# Patient Record
Sex: Male | Born: 1935 | Race: White | Hispanic: No | Marital: Married | Smoking: Former smoker
Health system: Southern US, Community
[De-identification: ages and names within clinical notes are randomized; demographics above are authoritative.]

## PROBLEM LIST (undated history)

## (undated) DIAGNOSIS — C61 Malignant neoplasm of prostate: Secondary | ICD-10-CM

## (undated) DIAGNOSIS — M1990 Unspecified osteoarthritis, unspecified site: Secondary | ICD-10-CM

## (undated) DIAGNOSIS — H269 Unspecified cataract: Secondary | ICD-10-CM

## (undated) DIAGNOSIS — I10 Essential (primary) hypertension: Secondary | ICD-10-CM

## (undated) HISTORY — PX: CATARACT EXTRACTION: SUR2

---

## 1969-05-01 HISTORY — PX: CHOLECYSTECTOMY: SHX55

## 2013-12-26 ENCOUNTER — Emergency Department (HOSPITAL_COMMUNITY): Payer: Medicare Other

## 2013-12-26 ENCOUNTER — Encounter (HOSPITAL_COMMUNITY): Payer: Self-pay | Admitting: Emergency Medicine

## 2013-12-26 ENCOUNTER — Emergency Department (INDEPENDENT_AMBULATORY_CARE_PROVIDER_SITE_OTHER)
Admission: EM | Admit: 2013-12-26 | Discharge: 2013-12-26 | Disposition: A | Discharge status: Hospice | Payer: Medicare Other | Source: Home / Self Care | Attending: Family Medicine | Admitting: Family Medicine

## 2013-12-26 ENCOUNTER — Inpatient Hospital Stay (HOSPITAL_COMMUNITY)
Admission: EM | Admit: 2013-12-26 | Discharge: 2013-12-30 | DRG: 682 | Disposition: A | Discharge status: Home / Self Care | Payer: Medicare Other | Attending: Internal Medicine | Admitting: Internal Medicine

## 2013-12-26 DIAGNOSIS — R634 Abnormal weight loss: Secondary | ICD-10-CM

## 2013-12-26 DIAGNOSIS — E43 Unspecified severe protein-calorie malnutrition: Secondary | ICD-10-CM

## 2013-12-26 DIAGNOSIS — Z681 Body mass index (BMI) 19 or less, adult: Secondary | ICD-10-CM

## 2013-12-26 DIAGNOSIS — R040 Epistaxis: Secondary | ICD-10-CM | POA: Diagnosis not present

## 2013-12-26 DIAGNOSIS — R339 Retention of urine, unspecified: Secondary | ICD-10-CM | POA: Diagnosis present

## 2013-12-26 DIAGNOSIS — I447 Left bundle-branch block, unspecified: Secondary | ICD-10-CM | POA: Diagnosis present

## 2013-12-26 DIAGNOSIS — N19 Unspecified kidney failure: Secondary | ICD-10-CM

## 2013-12-26 DIAGNOSIS — E872 Acidosis, unspecified: Secondary | ICD-10-CM | POA: Diagnosis present

## 2013-12-26 DIAGNOSIS — E875 Hyperkalemia: Secondary | ICD-10-CM

## 2013-12-26 DIAGNOSIS — N401 Enlarged prostate with lower urinary tract symptoms: Secondary | ICD-10-CM | POA: Diagnosis present

## 2013-12-26 DIAGNOSIS — N138 Other obstructive and reflux uropathy: Secondary | ICD-10-CM | POA: Diagnosis present

## 2013-12-26 DIAGNOSIS — N179 Acute kidney failure, unspecified: Principal | ICD-10-CM

## 2013-12-26 DIAGNOSIS — Z66 Do not resuscitate: Secondary | ICD-10-CM | POA: Diagnosis present

## 2013-12-26 DIAGNOSIS — E870 Hyperosmolality and hypernatremia: Secondary | ICD-10-CM | POA: Diagnosis present

## 2013-12-26 DIAGNOSIS — I10 Essential (primary) hypertension: Secondary | ICD-10-CM

## 2013-12-26 DIAGNOSIS — N139 Obstructive and reflux uropathy, unspecified: Secondary | ICD-10-CM

## 2013-12-26 LAB — COMPREHENSIVE METABOLIC PANEL
ALBUMIN: 3.8 g/dL (ref 3.5–5.2)
ALK PHOS: 98 U/L (ref 39–117)
ALT: 12 U/L (ref 0–53)
AST: 12 U/L (ref 0–37)
BUN: 110 mg/dL — ABNORMAL HIGH (ref 6–23)
CALCIUM: 9.5 mg/dL (ref 8.4–10.5)
CO2: 11 mEq/L — ABNORMAL LOW (ref 19–32)
Chloride: 115 mEq/L — ABNORMAL HIGH (ref 96–112)
Creatinine, Ser: 13.34 mg/dL — ABNORMAL HIGH (ref 0.50–1.35)
GFR calc non Af Amer: 3 mL/min — ABNORMAL LOW (ref >90)
GFR, EST AFRICAN AMERICAN: 3 mL/min — AB (ref >90)
GLUCOSE: 94 mg/dL (ref 70–99)
POTASSIUM: 7.1 meq/L — AB (ref 3.7–5.3)
Sodium: 146 mEq/L (ref 137–147)
TOTAL PROTEIN: 7.5 g/dL (ref 6.0–8.3)
Total Bilirubin: 0.3 mg/dL (ref 0.3–1.2)

## 2013-12-26 LAB — BASIC METABOLIC PANEL
BUN: 86 mg/dL — ABNORMAL HIGH (ref 6–23)
CALCIUM: 9.9 mg/dL (ref 8.4–10.5)
CHLORIDE: 117 meq/L — AB (ref 96–112)
CO2: 14 mEq/L — ABNORMAL LOW (ref 19–32)
CREATININE: 8.03 mg/dL — AB (ref 0.50–1.35)
GFR calc Af Amer: 6 mL/min — ABNORMAL LOW (ref >90)
GFR calc non Af Amer: 5 mL/min — ABNORMAL LOW (ref >90)
GLUCOSE: 85 mg/dL (ref 70–99)
Potassium: 5 mEq/L (ref 3.7–5.3)
Sodium: 150 mEq/L — ABNORMAL HIGH (ref 137–147)

## 2013-12-26 LAB — CBC WITH DIFFERENTIAL/PLATELET
BASOS ABS: 0 10*3/uL (ref 0.0–0.1)
Basophils Relative: 0 % (ref 0–1)
EOS PCT: 2 % (ref 0–5)
Eosinophils Absolute: 0.2 10*3/uL (ref 0.0–0.7)
HCT: 42.1 % (ref 39.0–52.0)
Hemoglobin: 14 g/dL (ref 13.0–17.0)
LYMPHS ABS: 0.7 10*3/uL (ref 0.7–4.0)
Lymphocytes Relative: 8 % — ABNORMAL LOW (ref 12–46)
MCH: 28.6 pg (ref 26.0–34.0)
MCHC: 33.3 g/dL (ref 30.0–36.0)
MCV: 86.1 fL (ref 78.0–100.0)
Monocytes Absolute: 0.9 10*3/uL (ref 0.1–1.0)
Monocytes Relative: 10 % (ref 3–12)
NEUTROS PCT: 80 % — AB (ref 43–77)
Neutro Abs: 7 10*3/uL (ref 1.7–7.7)
PLATELETS: 265 10*3/uL (ref 150–400)
RBC: 4.89 MIL/uL (ref 4.22–5.81)
RDW: 16.1 % — AB (ref 11.5–15.5)
WBC: 8.8 10*3/uL (ref 4.0–10.5)

## 2013-12-26 LAB — I-STAT ARTERIAL BLOOD GAS, ED
Acid-base deficit: 16 mmol/L — ABNORMAL HIGH (ref 0.0–2.0)
Bicarbonate: 8.6 mEq/L — ABNORMAL LOW (ref 20.0–24.0)
O2 Saturation: 97 %
PH ART: 7.251 — AB (ref 7.350–7.450)
TCO2: 9 mmol/L (ref 0–100)
pCO2 arterial: 19.7 mmHg — CL (ref 35.0–45.0)
pO2, Arterial: 107 mmHg — ABNORMAL HIGH (ref 80.0–100.0)

## 2013-12-26 LAB — URINALYSIS, ROUTINE W REFLEX MICROSCOPIC
Bilirubin Urine: NEGATIVE
Glucose, UA: NEGATIVE mg/dL
Ketones, ur: NEGATIVE mg/dL
Leukocytes, UA: NEGATIVE
NITRITE: NEGATIVE
PH: 5.5 (ref 5.0–8.0)
Protein, ur: NEGATIVE mg/dL
SPECIFIC GRAVITY, URINE: 1.017 (ref 1.005–1.030)
Urobilinogen, UA: 0.2 mg/dL (ref 0.0–1.0)

## 2013-12-26 LAB — TROPONIN I

## 2013-12-26 LAB — CBC
HCT: 41.9 % (ref 39.0–52.0)
HEMOGLOBIN: 14.2 g/dL (ref 13.0–17.0)
MCH: 28.7 pg (ref 26.0–34.0)
MCHC: 33.9 g/dL (ref 30.0–36.0)
MCV: 84.6 fL (ref 78.0–100.0)
PLATELETS: 254 10*3/uL (ref 150–400)
RBC: 4.95 MIL/uL (ref 4.22–5.81)
RDW: 16 % — ABNORMAL HIGH (ref 11.5–15.5)
WBC: 9.3 10*3/uL (ref 4.0–10.5)

## 2013-12-26 LAB — URINE MICROSCOPIC-ADD ON

## 2013-12-26 LAB — MRSA PCR SCREENING: MRSA BY PCR: NEGATIVE

## 2013-12-26 LAB — LIPASE, BLOOD: LIPASE: 78 U/L — AB (ref 11–59)

## 2013-12-26 MED ORDER — SODIUM CHLORIDE 0.9 % IV SOLN: INTRAVENOUS | Status: DC

## 2013-12-26 MED ORDER — ONDANSETRON HCL 4 MG/2ML IJ SOLN: 4.0000 mg | Freq: Four times a day (QID) | INTRAMUSCULAR | Status: DC | PRN

## 2013-12-26 MED ORDER — GUAIFENESIN-DM 100-10 MG/5ML PO SYRP
5.0000 mL | ORAL_SOLUTION | ORAL | Status: DC | PRN
  Filled 2013-12-26: qty 5

## 2013-12-26 MED ORDER — SODIUM POLYSTYRENE SULFONATE 15 GM/60ML PO SUSP
30.0000 g | Freq: Once | ORAL | Status: AC
  Administered 2013-12-26: 30 g via ORAL
  Filled 2013-12-26: qty 120

## 2013-12-26 MED ORDER — ACETAMINOPHEN 650 MG RE SUPP: 650.0000 mg | Freq: Four times a day (QID) | RECTAL | Status: DC | PRN

## 2013-12-26 MED ORDER — TAMSULOSIN HCL 0.4 MG PO CAPS
0.4000 mg | ORAL_CAPSULE | Freq: Every day | ORAL | Status: DC
  Administered 2013-12-26 – 2013-12-30 (×5): 0.4 mg via ORAL
  Filled 2013-12-26 (×5): qty 1

## 2013-12-26 MED ORDER — INSULIN ASPART 100 UNIT/ML ~~LOC~~ SOLN
10.0000 [IU] | Freq: Once | SUBCUTANEOUS | Status: AC
  Administered 2013-12-26: 10 [IU] via INTRAVENOUS
  Filled 2013-12-26: qty 1

## 2013-12-26 MED ORDER — ONDANSETRON HCL 4 MG PO TABS
4.0000 mg | ORAL_TABLET | Freq: Four times a day (QID) | ORAL | Status: DC | PRN
  Administered 2013-12-29: 4 mg via ORAL
  Filled 2013-12-26: qty 1

## 2013-12-26 MED ORDER — ALBUTEROL SULFATE (2.5 MG/3ML) 0.083% IN NEBU: 2.5000 mg | INHALATION_SOLUTION | RESPIRATORY_TRACT | Status: DC | PRN

## 2013-12-26 MED ORDER — HEPARIN SODIUM (PORCINE) 5000 UNIT/ML IJ SOLN
5000.0000 [IU] | Freq: Three times a day (TID) | INTRAMUSCULAR | Status: DC
  Administered 2013-12-26 – 2013-12-28 (×5): 5000 [IU] via SUBCUTANEOUS
  Filled 2013-12-26 (×10): qty 1

## 2013-12-26 MED ORDER — DEXTROSE 50 % IV SOLN
1.0000 | Freq: Once | INTRAVENOUS | Status: AC
  Administered 2013-12-26: 50 mL via INTRAVENOUS
  Filled 2013-12-26: qty 50

## 2013-12-26 MED ORDER — AMLODIPINE BESYLATE 5 MG PO TABS
5.0000 mg | ORAL_TABLET | Freq: Every day | ORAL | Status: DC
  Administered 2013-12-26 – 2013-12-30 (×5): 5 mg via ORAL
  Filled 2013-12-26 (×5): qty 1

## 2013-12-26 MED ORDER — DEXTROSE 5 % IV SOLN
INTRAVENOUS | Status: DC
  Administered 2013-12-26 – 2013-12-27 (×2): via INTRAVENOUS
  Filled 2013-12-26 (×5): qty 1000

## 2013-12-26 MED ORDER — SODIUM CHLORIDE 0.9 % IJ SOLN
3.0000 mL | Freq: Two times a day (BID) | INTRAMUSCULAR | Status: DC
  Administered 2013-12-26 – 2013-12-29 (×6): 3 mL via INTRAVENOUS

## 2013-12-26 MED ORDER — ACETAMINOPHEN 325 MG PO TABS: 650.0000 mg | ORAL_TABLET | Freq: Four times a day (QID) | ORAL | Status: DC | PRN

## 2013-12-26 MED ORDER — SODIUM CHLORIDE 0.9 % IV SOLN
1.0000 g | Freq: Once | INTRAVENOUS | Status: AC
  Administered 2013-12-26: 1 g via INTRAVENOUS
  Filled 2013-12-26: qty 10

## 2013-12-26 NOTE — H&P (Addendum)
PATIENT DETAILS Name: Brett Anthony Age: 80 y.o. Sex: male Date of Birth: [birth year] Admit Date: 12/26/2013 PCP:No primary provider on file.   CHIEF COMPLAINT:  Malaise, weight loss, poor appetite for the past 6 weeks  HPI: Brett Anthony is a 80 y.o. male with no significant Past Medical History  who presents today with the above noted complaint. Patient has not seen a physician since 1982. For the past 6 weeks, patient has had malaise, weakness and poor appetite. He has been drinking a lot of liquids and Ensure. He also claims that for the past 2 days he has had intermittent loose stools-2-3 times a day. Denies any fever. He claims that he has aches and pains and for the past 2 weeks he has been taking Advil at least twice a day on a daily basis. This morning he was weak, could not get up and was having dizziness upon attempting to stand up. As his result he was brought to the emergency room where he was found to have a creatinine of 13 with a potassium of 7.1. As a result I was asked to admit this patient for further evaluation and treatment. During my evaluation, patient was found to have a significantly distended bladder, a Foley catheter was placed, within a few minutes at least 800 cc of urine drained, and was continuing to drain during this dictation. Please note, normally at baseline patient is very functional, he is very much awake and alert, is able to ambulate, and is independent of all activities of daily living. Patient denies any nausea, vomiting. He denies any fever headache. He does claim to have a poor stream when he urinates and he dribbles a lot. No history of abdominal pain except for intermittent lower abdominal pain. Per family he has had a unquantified amount of weight loss since the past 6 weeks.   ALLERGIES:  No Known Allergies  PAST MEDICAL HISTORY: History reviewed. No pertinent past medical history.  PAST SURGICAL HISTORY: History reviewed. No  pertinent past surgical history.  MEDICATIONS AT HOME: Prior to Admission medications   Not on File    FAMILY HISTORY: Brother-CAD Daughter-lupus  SOCIAL HISTORY:  reports that he has never smoked. He does not have any smokeless tobacco history on file. He reports that he does not drink alcohol or use illicit drugs.  REVIEW OF SYSTEMS:  Constitutional:   No night sweats,  Fevers, chills,  HEENT:    No headaches, Difficulty swallowing,Tooth/dental problems,Sore throat,  No sneezing, itching, ear ache, nasal congestion, post nasal drip,   Cardio-vascular: No chest pain,  Orthopnea, PND, swelling in lower extremities, anasarca, dizziness, palpitations  GI:  No heartburn, indigestion, abdominal pain, nausea, vomiting,  change in bowel habits  Resp: No shortness of breath with exertion or at rest.  No excess mucus, no productive cough, No non-productive cough,  No coughing up of blood.No change in color of mucus.No wheezing.No chest wall deformity  Skin:  no rash or lesions.  GU:  no dysuria, change in color of urine, no urgency or frequency.  No flank pain.  Musculoskeletal: No joint pain or swelling.  No decreased range of motion.  No back pain.  Psych: No change in mood or affect. No depression or anxiety.  No memory loss.   PHYSICAL EXAM: Blood pressure 164/102, pulse 78, temperature 97.6 F (36.4 C), temperature source Oral, resp. rate 23, height 5\' 5"  (1.651 m), weight 55.792 kg (123 lb), SpO2 100.00%.  General  appearance :Awake, alert, not in any distress. Speech Clear. Not toxic Looking HEENT: Atraumatic and Normocephalic, pupils equally reactive to light and accomodation Neck: supple, no JVD. No cervical lymphadenopathy.  Chest:Good air entry bilaterally, no added sounds  CVS: S1 S2 regular, no murmurs.  Abdomen: Bowel sounds present, Non tender, significantly distended and palpable bladder. Otherwise the abdomen is nontender. Extremities: B/L Lower Ext shows  no edema, both legs are warm to touch Neurology: Awake alert, and oriented X 3, CN II-XII intact, Non focal Skin:No Rash Wounds:N/A  LABS ON ADMISSION:   Recent Labs  12/26/13 1614  NA 146  K 7.1*  CL 115*  CO2 11*  GLUCOSE 94  BUN 110*  CREATININE 13.34*  CALCIUM 9.5    Recent Labs  12/26/13 1614  AST 12  ALT 12  ALKPHOS 98  BILITOT 0.3  PROT 7.5  ALBUMIN 3.8    Recent Labs  12/26/13 1614  LIPASE 78*    Recent Labs  12/26/13 1614  WBC 8.8  NEUTROABS 7.0  HGB 14.0  HCT 42.1  MCV 86.1  PLT 265    Recent Labs  12/26/13 1614  TROPONINI <0.30   No results found for this basename: DDIMER,  in the last 72 hours No components found with this basename: POCBNP,    RADIOLOGIC STUDIES ON ADMISSION: Dg Chest Port 1 View  12/26/2013   CLINICAL DATA:  Weight loss and diarrhea with loss of appetite  EXAM: PORTABLE CHEST - 1 VIEW  COMPARISON:  None.  FINDINGS: The lungs are well-expanded. The interstitial markings are increased bilaterally. There is no alveolar infiltrate. There is no pleural effusion or pneumothorax. The cardiopericardial silhouette is normal in size. The pulmonary vascularity is not engorged. The mediastinum is normal in width. The observed portions of the bony thorax exhibit no acute abnormalities.  IMPRESSION: There is no focal pneumonia nor evidence of CHF. Prominence of the pulmonary interstitial markings is nonspecific. Given the lack of any cardiopulmonary symptoms this is likely chronic. A followup PA and lateral chest x-ray would be of value when the patient can tolerate the procedure.   Electronically Signed   By: David  Martinique   On: 12/26/2013 18:07     EKG: Independently reviewed. Left bundle branch block  ASSESSMENT AND PLAN: Present on Admission:  . Acute renal failure  - Likely secondary to obstructive uropathy from an enlarged prostate, with other contributions from poor oral intake and use of nonsteroidal anti-inflammatory  medications  - Will place a Foley catheter, hydrate, straight intake and output. Hydrate with IV fluids, patient on exam appears to be slightly on the dry side. Hopefully, with placement of Foley catheter patient's renal failure will improve.Have consulted nephrology (spoke with Dr Moshe Cipro), they will evaluate tomorrow. If hyperkalemia persists, may need to consult them officially tonight.   . Acute urinary retention - secondary to obstructive uropathy- from likely enlarged prostate. - Foley catheter placed, start Flomax - Will need outpatient urology evaluation for further workup, and a voiding trial.   . Hyperkalemia  - Secondary to acute renal failure  - Will treat with insulin, dextrose along with Kayexalate. Will place on a bicarbonate infusion. - Recheck electrolytes at 10:30 AM, if still persistently elevated, may need to notify nephrology   . Metabolic acidosis - Likely secondary to acute renal failure - Will place on a bicarbonate infusion, hopefully with placement of a Foley catheter, renal failure will resolve and patient's lab values improved.  Marland Kitchen HTN (hypertension) - Start  low-dose amlodipine  . Left bundle branch block - Suspect this is old, no chest pain or shortness of breath. Given renal failure, not a candidate for any acute intervention. Continue to monitor in telemetry. Will  not initiate any workup at this time.  Further plan will depend as patient's clinical course evolves and further radiologic and laboratory data become available. Patient will be monitored closely.   Above noted plan was discussed with patient/son-in-law, they were in agreement.   DVT Prophylaxis: Prophylactic Heparin  Code Status: DNR- son-in-law and RN present at bedside.  Total time spent for admission equals 45 minutes.  Jonetta Osgood [facility] Pager [phone]  If 7PM-7AM, please contact night-coverage [email or web address] Password [facility] 12/26/2013, 6:29 PM

## 2013-12-26 NOTE — ED Notes (Signed)
Attempted report 

## 2013-12-26 NOTE — ED Notes (Addendum)
C/o dizziness, weight loss, and poor appetite, 25 pound weight loss over 6 weeks.  Patient last saw a physician in 1982

## 2013-12-26 NOTE — ED Provider Notes (Signed)
CSN: [id number]     Arrival date & time 12/26/13  1431 History   First MD Initiated Contact with Patient 12/26/13 1457     No chief complaint on file.  (Consider location/radiation/quality/duration/timing/severity/associated sxs/prior Treatment) Patient is a 80 y.o. male presenting with general illness. The history is provided by the patient and a relative.  Illness Severity:  Moderate Onset quality:  Gradual Progression:  Worsening Chronicity:  New Context:  25lb wt loss in 6wks with anorexia, recent diarrhea. Associated symptoms: diarrhea   Associated symptoms: no abdominal pain, no fever, no nausea and no vomiting     No past medical history on file. No past surgical history on file. No family history on file. History  Substance Use Topics  . Smoking status: Not on file  . Smokeless tobacco: Not on file  . Alcohol Use: Not on file    Review of Systems  Constitutional: Positive for activity change and appetite change. Negative for fever.  Gastrointestinal: Positive for diarrhea. Negative for nausea, vomiting and abdominal pain.  Genitourinary: Negative.   Neurological: Positive for dizziness.    Allergies  Review of patient's allergies indicates not on file.  Home Medications   Prior to Admission medications   Not on File   BP 200/79  Pulse 78  Temp(Src) 98.5 F (36.9 C) (Oral)  Resp 14  SpO2 100% Physical Exam  Nursing note and vitals reviewed. Constitutional: He is oriented to person, place, and time. He appears cachectic. He is cooperative. He appears ill.  Cardiovascular: Normal heart sounds.   Pulmonary/Chest: Breath sounds normal.  Abdominal: Soft. Bowel sounds are normal. He exhibits no distension and no mass. There is no tenderness. There is no rebound and no guarding.  Musculoskeletal: He exhibits no edema and no tenderness.  Lymphadenopathy:    He has cervical adenopathy.  Neurological: He is alert and oriented to person, place, and time.  Skin:  Skin is warm and dry.    ED Course  Procedures (including critical care time) Labs Review Labs Reviewed - No data to display  Imaging Review No results found.   MDM   1. Weight loss, unintentional    Sent for medical w/u of anorexia and 25lb wt loss over 6 wks.    Billy Fischer, MD 12/26/13 [phone]

## 2013-12-26 NOTE — ED Notes (Signed)
Pt and family member reports pt going to ucc today due to weight loss of 25lbs over the past 6 weeks. Pt reports no appetite, not eating, denies vomiting but had recent diarrhea over past few days and chills, generalized fatigue and dizziness.

## 2013-12-26 NOTE — ED Provider Notes (Signed)
CSN: [id number]     Arrival date & time 12/26/13  1552 History   First MD Initiated Contact with Patient 12/26/13 1719     Chief Complaint  Patient presents with  . Weight Loss  . Diarrhea     (Consider location/radiation/quality/duration/timing/severity/associated sxs/prior Treatment) Patient is a 80 y.o. male presenting with diarrhea. The history is provided by the patient and a relative.  Diarrhea  patient here complaining of 25 pound weight loss over the past [redacted] weeks along with increased weakness. He has been using Ensure for his situation. Denies ingesting any solid foods. Weakness is worse with standing. Has had watery diarrhea without fever or chills. Denies any chest pain or abdominal pain. Went to urgent care Center and was sent here for further evaluation. Denies any night sweats. No treatment used for this prior to arrival. Patient hasn't seen a physician in over 33 years.  History reviewed. No pertinent past medical history. History reviewed. No pertinent past surgical history. History reviewed. No pertinent family history. History  Substance Use Topics  . Smoking status: Never Smoker   . Smokeless tobacco: Not on file  . Alcohol Use: No    Review of Systems  Gastrointestinal: Positive for diarrhea.  All other systems reviewed and are negative.     Allergies  Review of patient's allergies indicates no known allergies.  Home Medications   Prior to Admission medications   Medication Sig Start Date End Date Taking? Authorizing Provider  OVER THE COUNTER MEDICATION Eye drops    Historical Provider, MD   BP 189/69  Pulse 79  Temp(Src) 97.6 F (36.4 C) (Oral)  Resp 18  Ht 5\' 5"  (1.651 m)  Wt 123 lb (55.792 kg)  BMI 20.47 kg/m2  SpO2 99% Physical Exam  Nursing note and vitals reviewed. Constitutional: He is oriented to person, place, and time. He appears cachectic.  Non-toxic appearance. He has a sickly appearance. No distress.  HENT:  Head: Normocephalic  and atraumatic.  Eyes: Conjunctivae, EOM and lids are normal. Pupils are equal, round, and reactive to light.  Neck: Normal range of motion. Neck supple. No tracheal deviation present. No mass present.  Cardiovascular: Normal rate, regular rhythm and normal heart sounds.  Exam reveals no gallop.   No murmur heard. Pulmonary/Chest: Effort normal and breath sounds normal. No stridor. No respiratory distress. He has no decreased breath sounds. He has no wheezes. He has no rhonchi. He has no rales.  Abdominal: Soft. Normal appearance and bowel sounds are normal. He exhibits no distension. There is no tenderness. There is no rebound and no CVA tenderness.  Musculoskeletal: Normal range of motion. He exhibits no edema and no tenderness.  Neurological: He is alert and oriented to person, place, and time. He has normal strength. No cranial nerve deficit or sensory deficit. GCS eye subscore is 4. GCS verbal subscore is 5. GCS motor subscore is 6.  Skin: Skin is warm and dry. No abrasion and no rash noted.  Psychiatric: He has a normal mood and affect. His speech is normal and behavior is normal.    ED Course  Procedures (including critical care time) Labs Review Labs Reviewed  CBC WITH DIFFERENTIAL - Abnormal; Notable for the following:    RDW 16.1 (*)    All other components within normal limits  COMPREHENSIVE METABOLIC PANEL - Abnormal; Notable for the following:    Potassium 7.1 (*)    Chloride 115 (*)    CO2 11 (*)    BUN 110 (*)  Creatinine, Ser 13.34 (*)    GFR calc non Af Amer 3 (*)    GFR calc Af Amer 3 (*)    All other components within normal limits  LIPASE, BLOOD - Abnormal; Notable for the following:    Lipase 78 (*)    All other components within normal limits  URINALYSIS, ROUTINE W REFLEX MICROSCOPIC  TROPONIN I  BLOOD GAS, ARTERIAL    Imaging Review No results found.   EKG Interpretation None      MDM   Final diagnoses:  None     Date: 12/26/2013  Rate:  75  Rhythm: normal sinus rhythm  QRS Axis: normal  Intervals: normal  ST/T Wave abnormalities: nonspecific ST changes  Conduction Disutrbances:left bundle branch block  Narrative Interpretation:   Old EKG Reviewed: none available    Patient started on insulin, glucose, calcium for hyperkalemia. I did discuss CODE STATUS with the family and they are deciding at this point. Patient also to be given fluid resuscitation. Spoke with [facility] and patient to be admitted   [place] Performed by: Leota Jacobsen Total critical care time: 45 Critical care time was exclusive of separately billable procedures and treating other patients. Critical care was necessary to treat or prevent imminent or life-threatening deterioration. Critical care was time spent personally by me on the following activities: development of treatment plan with patient and/or surrogate as well as nursing, discussions with consultants, evaluation of patient's response to treatment, examination of patient, obtaining history from patient or surrogate, ordering and performing treatments and interventions, ordering and review of laboratory studies, ordering and review of radiographic studies, pulse oximetry and re-evaluation of patient's condition.   Leota Jacobsen, MD 12/26/13 1745

## 2013-12-27 DIAGNOSIS — E43 Unspecified severe protein-calorie malnutrition: Secondary | ICD-10-CM | POA: Diagnosis present

## 2013-12-27 DIAGNOSIS — E870 Hyperosmolality and hypernatremia: Secondary | ICD-10-CM | POA: Diagnosis present

## 2013-12-27 DIAGNOSIS — N19 Unspecified kidney failure: Secondary | ICD-10-CM

## 2013-12-27 DIAGNOSIS — I517 Cardiomegaly: Secondary | ICD-10-CM

## 2013-12-27 LAB — RENAL FUNCTION PANEL
Albumin: 3.6 g/dL (ref 3.5–5.2)
BUN: 65 mg/dL — ABNORMAL HIGH (ref 6–23)
CALCIUM: 9.5 mg/dL (ref 8.4–10.5)
CO2: 15 mEq/L — ABNORMAL LOW (ref 19–32)
Chloride: 117 mEq/L — ABNORMAL HIGH (ref 96–112)
Creatinine, Ser: 4.78 mg/dL — ABNORMAL HIGH (ref 0.50–1.35)
GFR calc Af Amer: 11 mL/min — ABNORMAL LOW (ref >90)
GFR, EST NON AFRICAN AMERICAN: 9 mL/min — AB (ref >90)
GLUCOSE: 115 mg/dL — AB (ref 70–99)
PHOSPHORUS: 3.6 mg/dL (ref 2.3–4.6)
Potassium: 4.2 mEq/L (ref 3.7–5.3)
Sodium: 150 mEq/L — ABNORMAL HIGH (ref 137–147)

## 2013-12-27 LAB — CBC
HEMATOCRIT: 39.7 % (ref 39.0–52.0)
HEMOGLOBIN: 13.6 g/dL (ref 13.0–17.0)
MCH: 28.5 pg (ref 26.0–34.0)
MCHC: 34.3 g/dL (ref 30.0–36.0)
MCV: 83.1 fL (ref 78.0–100.0)
Platelets: 250 10*3/uL (ref 150–400)
RBC: 4.78 MIL/uL (ref 4.22–5.81)
RDW: 16 % — ABNORMAL HIGH (ref 11.5–15.5)
WBC: 8.2 10*3/uL (ref 4.0–10.5)

## 2013-12-27 LAB — TSH: TSH: 1.5 u[IU]/mL (ref 0.350–4.500)

## 2013-12-27 LAB — HEMOGLOBIN A1C
HEMOGLOBIN A1C: 5.7 % — AB (ref <5.7)
Mean Plasma Glucose: 117 mg/dL — ABNORMAL HIGH (ref <117)

## 2013-12-27 LAB — GLUCOSE, CAPILLARY: GLUCOSE-CAPILLARY: 114 mg/dL — AB (ref 70–99)

## 2013-12-27 MED ORDER — SODIUM CHLORIDE 0.9 % IV SOLN: INTRAVENOUS | Status: DC

## 2013-12-27 MED ORDER — DEXTROSE-NACL 5-0.45 % IV SOLN
INTRAVENOUS | Status: DC
  Administered 2013-12-27: 15:00:00 via INTRAVENOUS

## 2013-12-27 MED ORDER — ENSURE COMPLETE PO LIQD
237.0000 mL | Freq: Two times a day (BID) | ORAL | Status: DC
  Administered 2013-12-27 – 2013-12-29 (×3): 237 mL via ORAL

## 2013-12-27 NOTE — Progress Notes (Signed)
INITIAL NUTRITION ASSESSMENT  DOCUMENTATION CODES Per approved criteria  -Severe malnutrition in the context of acute illness or injury   INTERVENTION: Ensure Complete po BID, each supplement provides 350 kcal and 13 grams of protein RD to follow for nutrition care plan  NUTRITION DIAGNOSIS: Inadequate oral intake related to poor appetite as evidenced by patient report  Goal: Pt to meet >/= 90% of their estimated nutrition needs   Monitor:  PO & supplemental intake, weight, labs, I/O's  Reason for Assessment: Malnutrition Screening Tool Report  80 y.o. male  Admitting Dx: ARF (acute renal failure)  ASSESSMENT: 80 y.o. male with no significant PMH who presented with malaise, weakness and poor appetite; also has had intermittent loose stools-2-3 times a day.   Patient reports he's had a poor appetite for the past 6 weeks; repors he ate fairly well this AM; also states he's lost approximately 25 lbs in this time frame (severe); he reports all he's been consuming is a "cup of coffee and 1/2 a cookie;" would benefit from addition of nutrition supplements; amenable to Ensure Complete; RD to order.  Nutrition Focused Physical Exam:  Subcutaneous Fat:  Orbital Region: N/A Upper Arm Region: severe depletion Thoracic and Lumbar Region: N/A  Muscle:  Temple Region: mild to moderate depletion Clavicle Bone Region: mild to moderate depletion Clavicle and Acromion Bone Region: severe depletion Scapular Bone Region: N/A Dorsal Hand: N/A Patellar Region: N/A Anterior Thigh Region: N/A Posterior Calf Region: N/A  Edema: none  Patient meets criteria for severe malnutrition in the context of acute illness or injury as evidenced by < 50% intake of estimated energy requirement for > 5 days and 16% weight loss in < 2 months.  Height: Ht Readings from Last 1 Encounters:  12/26/13 5\' 5"  (1.651 m)    Weight: Wt Readings from Last 1 Encounters:  12/27/13 127 lb 13.9 oz (58 kg)     Ideal Body Weight: 136 lb  % Ideal Body Weight: 93%  Wt Readings from Last 10 Encounters:  12/27/13 127 lb 13.9 oz (58 kg)  12/26/13 123 lb (55.792 kg)    Usual Body Weight: 152 lb  % Usual Body Weight: 83%  BMI:  Body mass index is 21.28 kg/(m^2).  Estimated Nutritional Needs: Kcal: 1700-1900 Protein: 80-90 gm Fluid: 1.7-1.9 L  Skin: Intact  Diet Order: Cardiac  EDUCATION NEEDS: -No education needs identified at this time   Intake/Output Summary (Last 24 hours) at 12/27/13 1208 Last data filed at 12/27/13 1142  Gross per 24 hour  Intake 928.83 ml  Output   3300 ml  Net -2371.17 ml    Labs:   Recent Labs Lab 12/26/13 1614 12/26/13 2203 12/27/13 0244  NA 146 150* 150*  K 7.1* 5.0 4.2  CL 115* 117* 117*  CO2 11* 14* 15*  BUN 110* 86* 65*  CREATININE 13.34* 8.03* 4.78*  CALCIUM 9.5 9.9 9.5  PHOS  --   --  3.6  GLUCOSE 94 85 115*    CBG (last 3)   Recent Labs  12/27/13 0822  GLUCAP 114*    Scheduled Meds: . amLODipine  5 mg Oral Daily  . heparin  5,000 Units Subcutaneous 3 times per day  . sodium chloride  3 mL Intravenous Q12H  . tamsulosin  0.4 mg Oral Daily    Continuous Infusions: . sodium chloride      History reviewed. No pertinent past medical history.  History reviewed. No pertinent past surgical history.  Arthur Holms, RD, LDN  Pager #: [phone] After-Hours Pager #: [phone]

## 2013-12-27 NOTE — Progress Notes (Signed)
Patient received to room from 2c. Son and pt oriented to room and call light. Tele monitor placed on patient. Lungs CTA, denies pain. Will continue to monitor. Daleen Snook Reatha Armour

## 2013-12-27 NOTE — Progress Notes (Signed)
Echocardiogram 2D Echocardiogram has been performed.  Alyson Locket Teigen Parslow 12/27/2013, 8:25 AM

## 2013-12-27 NOTE — Evaluation (Signed)
Physical Therapy Evaluation Patient Details Name: Brett Anthony MRN: [id number] DOB: 1919-06-26 Today's Date: 12/27/2013   History of Present Illness  80 y.o. male admitted to [facility] on 12/26/13 with maliase, generalized weakness, and poor appetitie that has been getting progressively worse over a 6 week period of time.  Pt has no significant PMHx on file and has not been to a doctor since the 1980s.  Pt ultimately dx with acute renal failure, acute urinary retention, hypokalemia, metabolic acidosis, HTN, and L bundle branch block.    Clinical Impression  Pt is generally weak and deconditioned, making him more unsteady on his feet than usual.  I think with continued mobilization in the hospital and use of an assistive device (either RW or cane-TBD).  Pt will be safe to go home with his wife and grandson's supervision at discharge.   PT to follow acutely for deficits listed below.     Follow Up Recommendations Home health PT;Supervision - Intermittent    Equipment Recommendations  None recommended by PT    Recommendations for Other Services   NA     Precautions / Restrictions Precautions Precautions: Fall Precaution Comments: h/o stumbles, but pt reports no actual falls      Mobility  Bed Mobility Overal bed mobility: Modified Independent             General bed mobility comments: using bed rail for leverage  Transfers Overall transfer level: Needs assistance Equipment used: 1 person hand held assist Transfers: Sit to/from Stand Sit to Stand: Min assist         General transfer comment: Min assist to support truk over weak legs and to anteriorly translate trunk as the pt is leaning backwards initially  Ambulation/Gait Ambulation/Gait assistance: Min assist Ambulation Distance (Feet): 90 Feet Assistive device: 1 person hand held assist (and IV pole) Gait Pattern/deviations: Step-through pattern;Shuffle;Staggering left;Staggering right     General Gait Details:  Staggering gait pattern.  Pt is generally weak and off balance.           Balance Overall balance assessment: Needs assistance Sitting-balance support: Feet supported;No upper extremity supported Sitting balance-Leahy Scale: Good     Standing balance support: Single extremity supported Standing balance-Leahy Scale: Poor                               Pertinent Vitals/Pain PVCs on monitor during gait.     Home Living Family/patient expects to be discharged to:: Private residence Living Arrangements: Spouse/significant other (he cares for his wife.  She can only provide supervision) Available Help at Discharge: Family;Available 24 hours/day (grandson comes over daily, but is mentally handicap from [facility]) Type of Home: House Home Access: Stairs to enter Entrance Stairs-Rails: Can reach both;Left;Right Entrance Stairs-Number of Steps: 5 Home Layout: One level Home Equipment: Walker - 2 wheels;Cane - single point;Wheelchair - manual Additional Comments: Pt reports he has tried several times to get his grandson to move in with them, but he does not want to.  Pt relays wishes to have someone there with them (he and his wife) all the time.      Prior Function Level of Independence: Independent         Comments: Pt drives, does all the cooking and cleaning.  Grandson does the yardwork, but pt says he would if the grandson did not insist on doing it himself.         Extremity/Trunk Assessment  Upper Extremity Assessment: Generalized weakness           Lower Extremity Assessment: Generalized weakness      Cervical / Trunk Assessment: Normal  Communication   Communication: HOH  Cognition Arousal/Alertness: Awake/alert Behavior During Therapy: WFL for tasks assessed/performed Overall Cognitive Status: Within Functional Limits for tasks assessed                      General Comments General comments (skin integrity, edema, etc.): PVCs reported by  monitor while walking.           Assessment/Plan    PT Assessment Patient needs continued PT services  PT Diagnosis Difficulty walking;Abnormality of gait;Generalized weakness   PT Problem List Decreased strength;Decreased activity tolerance;Decreased balance;Decreased mobility;Decreased knowledge of use of DME  PT Treatment Interventions DME instruction;Gait training;Stair training;Functional mobility training;Therapeutic activities;Therapeutic exercise;Balance training;Neuromuscular re-education;Patient/family education   PT Goals (Current goals can be found in the Care Plan section) Acute Rehab PT Goals Patient Stated Goal: to go home PT Goal Formulation: With patient Time For Goal Achievement: 01/10/14 Potential to Achieve Goals: Good    Frequency Min 3X/week    End of Session Equipment Utilized During Treatment: Gait belt Activity Tolerance: Patient tolerated treatment well Patient left: in chair;with call bell/phone within reach;with family/visitor present;with nursing/sitter in room (in [facility] for transport to new room)           Time: 7425-9563 PT Time Calculation (min): 20 min   Charges:   PT Evaluation $Initial PT Evaluation Tier I: 1 Procedure PT Treatments $Gait Training: 8-22 mins        Desarie Feild B. [place], [place], DPT [phone]   12/27/2013, 3:13 PM

## 2013-12-27 NOTE — Consult Note (Signed)
Referring Provider: No ref. provider found Primary Care Physician:  No primary provider on file. Primary Nephrologist:  none  Reason for Consultation:   Acute non oliguric renal failure  Secondary to volume depletion  HPI: 6 weeks, patient has had malaise, weakness and poor appetite. He has been drinking a lot of liquids and Ensure. He also claims that for the past 2 days he has had intermittent loose stools-2-3 times a day. Metabolic acidosis resolving. This morning he was weak, could not get up and was having dizziness upon attempting to stand up. As his result he was brought to the emergency room where he was found to have a creatinine of 13 with a potassium of 7.1. He was found to have a distended bladder and brisk diuresis on placing a foley catheter. He has an unremarkable past medical history and has not needed phsician help in 30 years   History reviewed. No pertinent past medical history.  History reviewed. No pertinent past surgical history.  Prior to Admission medications   Not on File    Current Facility-Administered Medications  Medication Dose Route Frequency Provider Last Rate Last Dose  . 0.9 %  sodium chloride infusion   Intravenous Continuous Samella Parr, NP 100 mL/hr at 12/27/13 1210    . acetaminophen (TYLENOL) tablet 650 mg  650 mg Oral Q6H PRN Jonetta Osgood, MD       Or  . acetaminophen (TYLENOL) suppository 650 mg  650 mg Rectal Q6H PRN Shanker Kristeen Mans, MD      . albuterol (PROVENTIL) (2.5 MG/3ML) 0.083% nebulizer solution 2.5 mg  2.5 mg Nebulization Q2H PRN Jonetta Osgood, MD      . amLODipine (NORVASC) tablet 5 mg  5 mg Oral Daily Jonetta Osgood, MD   5 mg at 12/27/13 0956  . guaiFENesin-dextromethorphan (ROBITUSSIN DM) 100-10 MG/5ML syrup 5 mL  5 mL Oral Q4H PRN Jonetta Osgood, MD      . heparin injection 5,000 Units  5,000 Units Subcutaneous 3 times per day Jonetta Osgood, MD   5,000 Units at 12/27/13 [phone]  . ondansetron (ZOFRAN) tablet 4 mg   4 mg Oral Q6H PRN Shanker Kristeen Mans, MD       Or  . ondansetron [facility]) injection 4 mg  4 mg Intravenous Q6H PRN Shanker Kristeen Mans, MD      . sodium chloride 0.9 % injection 3 mL  3 mL Intravenous Q12H Jonetta Osgood, MD   3 mL at 12/27/13 0958  . tamsulosin (FLOMAX) capsule 0.4 mg  0.4 mg Oral Daily Jonetta Osgood, MD   0.4 mg at 12/27/13 0956    Allergies as of 12/26/2013  . (No Known Allergies)    History reviewed. No pertinent family history.  History   Social History  . Marital Status: Married    Spouse Name: N/A    Number of Children: N/A  . Years of Education: N/A   Occupational History  . Not on file.   Social History Main Topics  . Smoking status: Never Smoker   . Smokeless tobacco: Not on file  . Alcohol Use: No  . Drug Use: No  . Sexual Activity: Not on file   Other Topics Concern  . Not on file   Social History Narrative  . No narrative on file    Review of Systems: Gen: Denies any fever, chills, sweats, anorexia, fatigue, weakness, malaise, weight loss, and sleep disorder HEENT: No visual complaints, No history  of Retinopathy. Normal external appearance No Epistaxis or Sore throat. No sinusitis.   CV: Denies chest pain, angina, palpitations, syncope, orthopnea, PND, peripheral edema, and claudication. Resp: Denies dyspnea at rest, dyspnea with exercise, cough, sputum, wheezing, coughing up blood, and pleurisy. GI: Diarrhea and poor appetitie GU : Denies urinary burning, blood in urine, urinary frequency, urinary hesitancy, nocturnal urination, and urinary incontinence.  No renal calculi. Foley MS: Denies joint pain, limitation of movement, and swelling, stiffness, low back pain, extremity pain. Denies muscle weakness, cramps, atrophy.  No use of non steroidal antiinflammatory drugs. Derm: Denies rash, itching, dry skin, hives, moles, warts, or unhealing ulcers.  Psych: Denies depression, anxiety, memory loss, suicidal ideation, hallucinations,  paranoia, and confusion. Heme: Denies bruising, bleeding, and enlarged lymph nodes. Neuro: No headache.  No diplopia. No dysarthria.  No dysphasia.  No history of CVA.  No Seizures. No paresthesias.  No weakness. Endocrine No DM.  No Thyroid disease.  No Adrenal disease.  Physical Exam: Vital signs in last 24 hours: Temp:  [97.5 F (36.4 C)-98.5 F (36.9 C)] 98.2 F (36.8 C) (04/29 1141) Pulse Rate:  [73-94] 84 (04/29 1141) Resp:  [14-31] 22 (04/29 1141) BP: (132-200)/(53-123) 145/76 mmHg (04/29 1141) SpO2:  [96 %-100 %] 98 % (04/29 1141) Weight:  [55.792 kg (123 lb)-58.3 kg (128 lb 8.5 oz)] 58 kg (127 lb 13.9 oz) (04/29 0418) Last BM Date: 12/27/13 General:   Alert, ,elderly  Head:  Normocephalic and atraumatic. Eyes:  Sclera clear, no icterus.   Conjunctiva pink. Ears:  Normal auditory acuity. Nose:  No deformity, discharge,  or lesions. Mouth:  No deformity or lesions, dentition normal. Neck:  Supple; no masses or thyromegaly. JVP not elevated Lungs:  Clear throughout to auscultation.   No wheezes, crackles, or rhonchi. No acute distress. Heart:  Regular rate and rhythm; no murmurs, clicks, rubs,  or gallops. Abdomen:  Soft, nontender and nondistended. No masses, hepatosplenomegaly or hernias noted. Normal bowel sounds, without guarding, and without rebound.   Msk:  Symmetrical without gross deformities. Normal posture. Pulses:  No carotid, renal, femoral bruits. DP and PT symmetrical and equal Extremities:  Without clubbing or edema. Neurologic:  Alert and  oriented x4;  grossly normal neurologically. Skin:  Intact without significant lesions or rashes. Cervical Nodes:  No significant cervical adenopathy. Psych:  Alert and cooperative. Normal mood and affect.  Intake/Output from previous day: 04/28 0701 - 04/29 0700 In: 928.8 [P.O.:480; I.V.:448.8] Out: 2400 [Urine:2400] Intake/Output this shift: Total I/O In: -  Out: 900 [Urine:900]  Lab Results:  Recent Labs   12/26/13 1614 12/26/13 2203 12/27/13 0244  WBC 8.8 9.3 8.2  HGB 14.0 14.2 13.6  HCT 42.1 41.9 39.7  PLT 265 254 250   BMET  Recent Labs  12/26/13 1614 12/26/13 2203 12/27/13 0244  NA 146 150* 150*  K 7.1* 5.0 4.2  CL 115* 117* 117*  CO2 11* 14* 15*  GLUCOSE 94 85 115*  BUN 110* 86* 65*  CREATININE 13.34* 8.03* 4.78*  CALCIUM 9.5 9.9 9.5  PHOS  --   --  3.6   LFT  Recent Labs  12/26/13 1614 12/27/13 0244  PROT 7.5  --   ALBUMIN 3.8 3.6  AST 12  --   ALT 12  --   ALKPHOS 98  --   BILITOT 0.3  --    PT/INR No results found for this basename: LABPROT, INR,  in the last 72 hours Hepatitis Panel No results found for this  basename: HEPBSAG, HCVAB, HEPAIGM, HEPBIGM,  in the last 72 hours  Studies/Results: Dg Chest Port 1 View  12/26/2013   CLINICAL DATA:  Weight loss and diarrhea with loss of appetite  EXAM: PORTABLE CHEST - 1 VIEW  COMPARISON:  None.  FINDINGS: The lungs are well-expanded. The interstitial markings are increased bilaterally. There is no alveolar infiltrate. There is no pleural effusion or pneumothorax. The cardiopericardial silhouette is normal in size. The pulmonary vascularity is not engorged. The mediastinum is normal in width. The observed portions of the bony thorax exhibit no acute abnormalities.  IMPRESSION: There is no focal pneumonia nor evidence of CHF. Prominence of the pulmonary interstitial markings is nonspecific. Given the lack of any cardiopulmonary symptoms this is likely chronic. A followup PA and lateral chest x-ray would be of value when the patient can tolerate the procedure.   Electronically Signed   By: David  Martinique   On: 12/26/2013 18:07    Assessment/Plan:  Post obstructive diuresis  Will change to hypotonic fluids  Dehydration  Hyperkalemia resolved    LOS: [place] @TODAY @12 :11 PM

## 2013-12-27 NOTE — Progress Notes (Signed)
[facility]  Zack Crager YWV:[id number] DOB: Apr 25, 1919 DOA: 12/26/2013 PCP: No primary provider on file.  Time spent : 35 minutes  Brief narrative: 80 y.o. male with no significant Past Medical History who presented with lethargy, weight loss, poor appetite for 6 weeks. He had not seen a physician since 1982. For the past 6 weeks, patient has had malaise, weakness and poor appetite. He had been drinking a lot of liquids and Ensure. He endorsed for the past 2 days he has had intermittent loose stools-2-3 times a day. Denied any fever. He endorsed generalized myalgia for the past 2 weeks and had been taking Advil at least twice a day on a daily basis. On the morning of presentation he was weak, could not get up and was having dizziness upon attempting to stand up. As his result he was brought to the emergency room where he was found to have a creatinine of 13 with a potassium of 7.1.During the admitting physician's evaluation, patient was found to have a significantly distended bladder, a Foley catheter was placed, within a few minutes at least 800 cc of urine drained, and was continuing to drain during this dictation.   Please note, normally at baseline patient is very functional, he is very much awake and alert, is able to ambulate, and is independent of all activities of daily living.   Patient denied any nausea, vomiting. He denies any fever headache. He endorsed poor stream when he urinated and he dribbles a lot. No history of abdominal pain except for intermittent lower abdominal pain.  Per family he has had a unquantified amount of weight loss since the past 6 weeks.  HPI/Subjective: Today patient endorsed feeling much better than at time of presentation and asked if he was ready to go home. Currently no dizziness, no nausea or abdominal pain  Assessment/Plan:    Acute renal failure due to Obstructive uropathy -marked improvement since bladder  decompressed -presumed etiology based on reported history of sx's c/w BPH obstructive uropathy -cont foley until renal fnx normalized  -Flomax started this admit -if fails removal of foley would re insert, dc home with foley/leg bag with appt to see urology -Scr has decreased to 4.78 -Nephro following-no indication for HD at this time    Metabolic acidosis -2/2 ARF -serum bicarb has normalized so dc bicarb gtt    HTN  -BP controlled with Norvasc    Hyperkalemia -due to ARF- resolved    Hypernatremia -due to volume shifting and bicarb drip -Nephro changed to hypotonic fluids    Left bundle branch block -ECHO this admit normal noting expected finding of septal motion abnormal function, dyssynergy, and paradox which are consistent with a left bundle branch block.    Protein-calorie malnutrition, severe -cont Ensure  DVT prophylaxis: Subcutaneous heparin Code Status: DO NOT RESUSCITATE Family Communication: Family at bedside Disposition Plan/Expected LOS: Transfer to telemetry  Consultants: Nephrology  Procedures: None  Antibiotics: None  Objective: Blood pressure 145/76, pulse 84, temperature 98.2 F (36.8 C), temperature source Oral, resp. rate 22, height 5\' 5"  (1.651 m), weight 127 lb 13.9 oz (58 kg), SpO2 98.00%.  Intake/Output Summary (Last 24 hours) at 12/27/13 1311 Last data filed at 12/27/13 1142  Gross per 24 hour  Intake 928.83 ml  Output   3300 ml  Net -2371.17 ml   Exam: General: No acute respiratory distress Lungs: Clear to auscultation bilaterally without wheezes or crackles, RA Cardiovascular: Regular rate and  rhythm without murmur gallop or rub normal S1 and S2, no peripheral edema or JVD Abdomen: Nontender, nondistended, soft, bowel sounds positive, no rebound, no ascites, no appreciable mass Musculoskeletal: No significant cyanosis, clubbing of bilateral lower extremities   Scheduled Meds:  Scheduled Meds: . amLODipine  5 mg Oral Daily  .  feeding supplement (ENSURE COMPLETE)  237 mL Oral BID BM  . heparin  5,000 Units Subcutaneous 3 times per day  . sodium chloride  3 mL Intravenous Q12H  . tamsulosin  0.4 mg Oral Daily   Continuous Infusions: . dextrose 5 % and 0.45% NaCl      Data Reviewed: Basic Metabolic Panel:  Recent Labs Lab 12/26/13 1614 12/26/13 2203 12/27/13 0244  NA 146 150* 150*  K 7.1* 5.0 4.2  CL 115* 117* 117*  CO2 11* 14* 15*  GLUCOSE 94 85 115*  BUN 110* 86* 65*  CREATININE 13.34* 8.03* 4.78*  CALCIUM 9.5 9.9 9.5  PHOS  --   --  3.6   Liver Function Tests:  Recent Labs Lab 12/26/13 1614 12/27/13 0244  AST 12  --   ALT 12  --   ALKPHOS 98  --   BILITOT 0.3  --   PROT 7.5  --   ALBUMIN 3.8 3.6    Recent Labs Lab 12/26/13 1614  LIPASE 78*   CBC:  Recent Labs Lab 12/26/13 1614 12/26/13 2203 12/27/13 0244  WBC 8.8 9.3 8.2  NEUTROABS 7.0  --   --   HGB 14.0 14.2 13.6  HCT 42.1 41.9 39.7  MCV 86.1 84.6 83.1  PLT 265 254 250   Cardiac Enzymes:  Recent Labs Lab 12/26/13 1614  TROPONINI <0.30   CBG:  Recent Labs Lab 12/27/13 0822  GLUCAP 114*    Recent Results (from the past 240 hour(s))  MRSA PCR SCREENING     Status: None   Collection Time    12/26/13  9:04 PM      Result Value Ref Range Status   MRSA by PCR NEGATIVE  NEGATIVE Final   Comment:            The GeneXpert MRSA Assay (FDA     approved for NASAL specimens     only), is one component of a     comprehensive MRSA colonization     surveillance program. It is not     intended to diagnose MRSA     infection nor to guide or     monitor treatment for     MRSA infections.     Studies:  Recent x-ray studies have been reviewed in detail by the Attending Physician       Erin Hearing, ANP [facility] Office  [phone] Pager [phone]  **If unable to reach the above provider after paging please contact the [place] @ [phone]  On-Call/Text Page:      [email or web address]       password [email or web address]  If 7PM-7AM, please contact night-coverage [email or web address] Password [email or web address] 12/27/2013, 1:11 PM   LOS: 1 day   I have personally examined this patient and reviewed the entire database. I have reviewed the above note, made any necessary editorial changes, and agree with its content.  Cherene Altes, MD [facility]

## 2013-12-27 NOTE — Progress Notes (Signed)
Utilization review completed. Gail Vendetti, RN, BSN. 

## 2013-12-28 DIAGNOSIS — N139 Obstructive and reflux uropathy, unspecified: Secondary | ICD-10-CM

## 2013-12-28 DIAGNOSIS — E43 Unspecified severe protein-calorie malnutrition: Secondary | ICD-10-CM

## 2013-12-28 LAB — RENAL FUNCTION PANEL
Albumin: 3 g/dL — ABNORMAL LOW (ref 3.5–5.2)
BUN: 25 mg/dL — AB (ref 6–23)
CALCIUM: 8.6 mg/dL (ref 8.4–10.5)
CO2: 22 mEq/L (ref 19–32)
CREATININE: 1.13 mg/dL (ref 0.50–1.35)
Chloride: 109 mEq/L (ref 96–112)
GFR calc non Af Amer: 54 mL/min — ABNORMAL LOW (ref >90)
GFR, EST AFRICAN AMERICAN: 63 mL/min — AB (ref >90)
Glucose, Bld: 126 mg/dL — ABNORMAL HIGH (ref 70–99)
PHOSPHORUS: 2.1 mg/dL — AB (ref 2.3–4.6)
Potassium: 4 mEq/L (ref 3.7–5.3)
Sodium: 142 mEq/L (ref 137–147)

## 2013-12-28 LAB — GLUCOSE, CAPILLARY: Glucose-Capillary: 110 mg/dL — ABNORMAL HIGH (ref 70–99)

## 2013-12-28 NOTE — Care Management Note (Signed)
    Page 1 of 2   12/31/2013     7:15:01 AM CARE MANAGEMENT NOTE 12/31/2013  Patient:  Brett Anthony, Brett Anthony   Account Number:  [id number]  Date Initiated:  12/28/2013  Documentation initiated by:  AMERSON,JULIE  Subjective/Objective Assessment:   Pt adm on 12/26/13 with acute Renal Failure, weight loss. PTA, pt resides at home with spouse.     Action/Plan:   Will follow for dc needs as pt progresses.   Anticipated DC Date:  12/31/2013   Anticipated DC Plan:  [place]  CM consult      [facility] Choice  HOME HEALTH   Choice offered to / List presented to:  C-4 Adult Children        HH arranged  HH-1 RN  [place].   Status of service:  In process, will continue to follow Medicare Important Message given?  YES (If response is "NO", the following Medicare IM given date fields will be blank) Date Medicare IM given:  12/26/2013 Date Additional Medicare IM given:  12/29/2013  Discharge Disposition:  [place]  Per UR Regulation:  Reviewed for med. necessity/level of care/duration of stay  If discussed at [place] of Stay Meetings, dates discussed:    Comments:  12/30/13 MD called to request CM give a list of PCP resources to pt.  CM brought list of PCP resources to room and gave pt.  [place] notified of pt discharge.  No other CM needs were communicated.  Mariane Masters, BSN, CM [phone].  12/29/13 Ellan Lambert, RN, BSN [phone] Met with pt and son to discuss dc plans:  pt lives at home with 99 yo wife with Parkinson's disease.  Son states he takes care of her and maintains the home fairly well, but could use a little help.  Provided list of Private Duty Sitters and [facility] aides for son at his request.  Will arrange [facility] as discussed with MD:  referral to [facility], per pt/son choice.  Start of care 24-48h post dc date.

## 2013-12-28 NOTE — Progress Notes (Signed)
Chart reviewed.       Le Faulcon URK:[id number] DOB: [birth year] DOA: 12/26/2013 PCP: No primary provider on file.  Time spent : 35 minutes  Brief narrative: 80 y.o. male with no significant Past Medical History who presented with lethargy, weight loss, poor appetite for 6 weeks. He had not seen a physician since 1982. For the past 6 weeks, patient has had malaise, weakness and poor appetite. He had been drinking a lot of liquids and Ensure. He endorsed for the past 2 days he has had intermittent loose stools-2-3 times a day. Denied any fever. He endorsed generalized myalgia for the past 2 weeks and had been taking Advil at least twice a day on a daily basis. On the morning of presentation he was weak, could not get up and was having dizziness upon attempting to stand up. As his result he was brought to the emergency room where he was found to have a creatinine of 13 with a potassium of 7.1.During the admitting physician's evaluation, patient was found to have a significantly distended bladder, a Foley catheter was placed, within a few minutes at least 800 cc of urine drained, and was continuing to drain during this dictation.   Please note, normally at baseline patient is very functional, he is very much awake and alert, is able to ambulate, and is independent of all activities of daily living.   Patient denied any nausea, vomiting. He denies any fever headache. He endorsed poor stream when he urinated and he dribbles a lot. No history of abdominal pain except for intermittent lower abdominal pain.  Per family he has had a unquantified amount of weight loss since the past 6 weeks.  HPI/Subjective: Some epistaxis, not unusual for him. Some bleeding from meatus. No dyspnea. Appetite excellent.  Assessment/Plan:    Acute renal failure due to Obstructive uropathy Resolved. Voiding trial    Metabolic acidosis resolved    HTN  -BP controlled with Norvasc    Hyperkalemia -due to ARF-  resolved    Hypernatremia resolved    Left bundle branch block -ECHO this admit normal noting expected finding of septal motion abnormal function, dyssynergy, and paradox which are consistent with a left bundle branch block.    Protein-calorie malnutrition, severe -cont Ensure  Epistaxis, urethral bleeding: both mild but will hold [place] heparin and change to SCDs. D/c tele. Pt likely not interested in home pt, per family  DVT prophylaxis: Subcutaneous heparin Code Status: DO NOT RESUSCITATE Family Communication: Family at bedside Disposition Plan/Expected LOS: Tlikely home in am.  Consultants: Nephrology  Procedures: None  Antibiotics: None  Objective: Blood pressure 140/82, pulse 89, temperature 98 F (36.7 C), temperature source Oral, resp. rate 18, height 5\' 5"  (1.651 m), weight 57.8 kg (127 lb 6.8 oz), SpO2 98.00%.  Intake/Output Summary (Last 24 hours) at 12/28/13 1240 Last data filed at 12/28/13 0730  Gross per 24 hour  Intake    240 ml  Output   1600 ml  Net  -1360 ml   Exam: General: in chair, eating ravenously. HOH Lungs: Clear to auscultation bilaterally without wheezes or crackles, RA Cardiovascular: Regular rate and rhythm without murmur gallop or rub normal S1 and S2, no peripheral edema or JVD Abdomen: Nontender, nondistended, soft, bowel sounds positive, no rebound, no ascites, no appreciable mass Musculoskeletal: No significant cyanosis, clubbing of bilateral lower extremities   Scheduled Meds:  Scheduled Meds: . amLODipine  5 mg Oral Daily  . feeding supplement (ENSURE COMPLETE)  237 mL Oral  BID BM  . heparin  5,000 Units Subcutaneous 3 times per day  . sodium chloride  3 mL Intravenous Q12H  . tamsulosin  0.4 mg Oral Daily   Continuous Infusions: . dextrose 5 % and 0.45% NaCl 100 mL/hr at 12/27/13 1431    Data Reviewed: Basic Metabolic Panel:  Recent Labs Lab 12/26/13 1614 12/26/13 2203 12/27/13 0244 12/28/13 0515  NA 146 150* 150*  142  K 7.1* 5.0 4.2 4.0  CL 115* 117* 117* 109  CO2 11* 14* 15* 22  GLUCOSE 94 85 115* 126*  BUN 110* 86* 65* 25*  CREATININE 13.34* 8.03* 4.78* 1.13  CALCIUM 9.5 9.9 9.5 8.6  PHOS  --   --  3.6 2.1*   Liver Function Tests:  Recent Labs Lab 12/26/13 1614 12/27/13 0244 12/28/13 0515  AST 12  --   --   ALT 12  --   --   ALKPHOS 98  --   --   BILITOT 0.3  --   --   PROT 7.5  --   --   ALBUMIN 3.8 3.6 3.0*    Recent Labs Lab 12/26/13 1614  LIPASE 78*   CBC:  Recent Labs Lab 12/26/13 1614 12/26/13 2203 12/27/13 0244  WBC 8.8 9.3 8.2  NEUTROABS 7.0  --   --   HGB 14.0 14.2 13.6  HCT 42.1 41.9 39.7  MCV 86.1 84.6 83.1  PLT 265 254 250   Cardiac Enzymes:  Recent Labs Lab 12/26/13 1614  TROPONINI <0.30   CBG:  Recent Labs Lab 12/27/13 0822 12/28/13 0656  GLUCAP 114* 110*    Recent Results (from the past 240 hour(s))  MRSA PCR SCREENING     Status: None   Collection Time    12/26/13  9:04 PM      Result Value Ref Range Status   MRSA by PCR NEGATIVE  NEGATIVE Final   Comment:            The GeneXpert MRSA Assay (FDA     approved for NASAL specimens     only), is one component of a     comprehensive MRSA colonization     surveillance program. It is not     intended to diagnose MRSA     infection nor to guide or     monitor treatment for     MRSA infections.    Doree Barthel, MD [facility] [phone]  If 7PM-7AM, please contact night-coverage [email or web address] Password [email or web address] 12/28/2013, 12:40 PM   LOS: 2 days

## 2013-12-28 NOTE — Progress Notes (Signed)
Physical Therapy Treatment Patient Details Name: Brett Anthony MRN: [id number] DOB: [birth year] Today's Date: 12/28/2013    History of Present Illness 80 y.o. male admitted to [facility] on 12/26/13 with maliase, generalized weakness, and poor appetitie that has been getting progressively worse over a 6 week period of time.  Pt has no significant PMHx on file and has not been to a doctor since the 1980s.  Pt ultimately dx with acute renal failure, acute urinary retention, hypokalemia, metabolic acidosis, HTN, and L bundle branch block.      PT Comments    Pt making good progress and should be able to return home.  Follow Up Recommendations  Home health PT;Supervision - Intermittent;Other (comment) ([place] aide)     Equipment Recommendations  None recommended by PT    Recommendations for Other Services       Precautions / Restrictions Precautions Precautions: Fall Precaution Comments: h/o stumbles, but pt reports no actual falls    Mobility                 Transfers Overall transfer level: Needs assistance Equipment used: None Transfers: Sit to/from Stand Sit to Stand: Supervision         General transfer comment: min (A) with management of Rw  Ambulation/Gait Ambulation/Gait assistance: Min guard;Min assist Ambulation Distance (Feet): 350 Feet Assistive device: Rolling walker (2 wheeled);None Gait Pattern/deviations: Step-through pattern;Staggering left     General Gait Details: With walker in hallway pt without any staggering. In hallway without walker pt with stagger 2-3 times. In room without assistive device pt with more confident gait than in hallway.   Stairs            Wheelchair Mobility    Modified Rankin (Stroke Patients Only)       Balance Overall balance assessment: Needs assistance         Standing balance support: No upper extremity supported Standing balance-Leahy Scale: Good                      Cognition Arousal/Alertness:  Awake/alert Behavior During Therapy: WFL for tasks assessed/performed Overall Cognitive Status: Within Functional Limits for tasks assessed                      Exercises      General Comments        Pertinent Vitals/Pain VSS    Home Living Family/patient expects to be discharged to:: Private residence Living Arrangements: Spouse/significant other Available Help at Discharge: Family;Available 24 hours/day Type of Home: House Home Access: Stairs to enter Entrance Stairs-Rails: Can reach both;Left;Right Home Layout: One level Home Equipment: Walker - 2 wheels;Cane - single point;Wheelchair - manual Additional Comments: Pt reports he has tried several times to get his grandson to move in with them, but he does not want to.  Pt relays wishes to have someone there with them (he and his wife) all the time.      Prior Function Level of Independence: Independent      Comments: Pt drives, does all the cooking and cleaning.  Grandson does the yardwork, but pt says he would if the grandson did not insist on doing it himself.    PT Goals (current goals can now be found in the care plan section) Acute Rehab PT Goals Patient Stated Goal: to go home Progress towards PT goals: Progressing toward goals    Frequency  Min 3X/week    PT Plan Current plan remains appropriate  Co-evaluation             End of Session   Activity Tolerance: Patient tolerated treatment well Patient left: in chair;with call bell/phone within reach;with family/visitor present     Time: 0867-6195 PT Time Calculation (min): 10 min  Charges:  $Gait Training: 8-22 mins                    G CodesShary Decamp Aadith Raudenbush 2014-01-14, 5:08 PM  [employer] PT [phone]

## 2013-12-28 NOTE — Progress Notes (Signed)
[facility] ROUNDING NOTE   Subjective:   Interval History: improved renal function  Objective:  Vital signs in last 24 hours:  Temp:  [98 F (36.7 C)-98.2 F (36.8 C)] 98 F (36.7 C) (04/30 0430) Pulse Rate:  [84-89] 89 (04/30 0430) Resp:  [18-22] 18 (04/30 0430) BP: (116-145)/(62-87) 140/82 mmHg (04/30 0430) SpO2:  [98 %] 98 % (04/30 0430) Weight:  [57.8 kg (127 lb 6.8 oz)] 57.8 kg (127 lb 6.8 oz) (04/30 0430)  Weight change: 2.007 kg (4 lb 6.8 oz) Filed Weights   12/26/13 2044 12/27/13 0418 12/28/13 0430  Weight: 58.3 kg (128 lb 8.5 oz) 58 kg (127 lb 13.9 oz) 57.8 kg (127 lb 6.8 oz)    Intake/Output: I/O last 3 completed shifts: In: 928.8 [P.O.:480; I.V.:448.8] Out: 4900 [Urine:4900]   Intake/Output this shift:     CVS- RRR RS- CTA ABD- BS present soft non-distended EXT- no edema   Basic Metabolic Panel:  Recent Labs Lab 12/26/13 1614 12/26/13 2203 12/27/13 0244 12/28/13 0515  NA 146 150* 150* 142  K 7.1* 5.0 4.2 4.0  CL 115* 117* 117* 109  CO2 11* 14* 15* 22  GLUCOSE 94 85 115* 126*  BUN 110* 86* 65* 25*  CREATININE 13.34* 8.03* 4.78* 1.13  CALCIUM 9.5 9.9 9.5 8.6  PHOS  --   --  3.6 2.1*    Liver Function Tests:  Recent Labs Lab 12/26/13 1614 12/27/13 0244 12/28/13 0515  AST 12  --   --   ALT 12  --   --   ALKPHOS 98  --   --   BILITOT 0.3  --   --   PROT 7.5  --   --   ALBUMIN 3.8 3.6 3.0*    Recent Labs Lab 12/26/13 1614  LIPASE 78*   No results found for this basename: AMMONIA,  in the last 168 hours  CBC:  Recent Labs Lab 12/26/13 1614 12/26/13 2203 12/27/13 0244  WBC 8.8 9.3 8.2  NEUTROABS 7.0  --   --   HGB 14.0 14.2 13.6  HCT 42.1 41.9 39.7  MCV 86.1 84.6 83.1  PLT 265 254 250    Cardiac Enzymes:  Recent Labs Lab 12/26/13 1614  TROPONINI <0.30    BNP: No components found with this basename: POCBNP,   CBG:  Recent Labs Lab 12/27/13 0822 12/28/13 0656  GLUCAP 114* 110*     Microbiology: Results for orders placed during the hospital encounter of 12/26/13  MRSA PCR SCREENING     Status: None   Collection Time    12/26/13  9:04 PM      Result Value Ref Range Status   MRSA by PCR NEGATIVE  NEGATIVE Final   Comment:            The GeneXpert MRSA Assay (FDA     approved for NASAL specimens     only), is one component of a     comprehensive MRSA colonization     surveillance program. It is not     intended to diagnose MRSA     infection nor to guide or     monitor treatment for     MRSA infections.    Coagulation Studies: No results found for this basename: LABPROT, INR,  in the last 72 hours  Urinalysis:  Recent Labs  12/26/13 [place] 1.017  PHURINE 5.5  [place]  NEGATIVE  UROBILINOGEN 0.2  NITRITE NEGATIVE  LEUKOCYTESUR NEGATIVE      Imaging: Dg Chest Port 1 View  12/26/2013   CLINICAL DATA:  Weight loss and diarrhea with loss of appetite  EXAM: PORTABLE CHEST - 1 VIEW  COMPARISON:  None.  FINDINGS: The lungs are well-expanded. The interstitial markings are increased bilaterally. There is no alveolar infiltrate. There is no pleural effusion or pneumothorax. The cardiopericardial silhouette is normal in size. The pulmonary vascularity is not engorged. The mediastinum is normal in width. The observed portions of the bony thorax exhibit no acute abnormalities.  IMPRESSION: There is no focal pneumonia nor evidence of CHF. Prominence of the pulmonary interstitial markings is nonspecific. Given the lack of any cardiopulmonary symptoms this is likely chronic. A followup PA and lateral chest x-ray would be of value when the patient can tolerate the procedure.   Electronically Signed   By: David  Martinique   On: 12/26/2013 18:07     Medications:   . dextrose 5 % and 0.45% NaCl 100 mL/hr at 12/27/13 1431   . amLODipine  5 mg Oral Daily  . feeding  supplement (ENSURE COMPLETE)  237 mL Oral BID BM  . heparin  5,000 Units Subcutaneous 3 times per day  . sodium chloride  3 mL Intravenous Q12H  . tamsulosin  0.4 mg Oral Daily   acetaminophen, acetaminophen, albuterol, guaiFENesin-dextromethorphan, ondansetron (ZOFRAN) IV, ondansetron  Assessment/ Plan:  6 weeks, patient has had malaise, weakness and poor appetite. He has been drinking a lot of liquids and Ensure. He also claims that for the past 2 days he has had intermittent loose stools-2-3 times a day. Metabolic acidosis resolving. This morning he was weak, could not get up and was having dizziness upon attempting to stand up. As his result he was brought to the emergency room where he was found to have a creatinine of 13 with a potassium of 7.1. He was found to have a distended bladder and brisk diuresis on placing a foley catheter. He has an unremarkable past medical history and has not needed phsician help in 30 years  1. Renal function improved as has hyperkalemia and acid/ base status    Will sign off patient   No follow up needed    LOS: 2 Sherril Croon @TODAY @9 :46 AM

## 2013-12-28 NOTE — Evaluation (Signed)
Occupational Therapy Evaluation Patient Details Name: Brett Anthony MRN: [id number] DOB: 10-02-1918 Today's Date: 12/28/2013    History of Present Illness 80 y.o. male admitted to [facility] on 12/26/13 with maliase, generalized weakness, and poor appetitie that has been getting progressively worse over a 6 week period of time.  Pt has no significant PMHx on file and has not been to a doctor since the 1980s.  Pt ultimately dx with acute renal failure, acute urinary retention, hypokalemia, metabolic acidosis, HTN, and L bundle branch block.     Clinical Impression   PT admitted with maliase weakness and poor appetite. Pt currently with functional limitiations due to the deficits listed below (see OT problem list). Reports need for someone to help with pt and wife daily. Requesting home aide to help with ADLS and general household needs. Pt will benefit from skilled OT to increase their independence and safety with adls and balance to allow discharge [place] and AIDE.     Follow Up Recommendations  Home health OT;Other (comment) (home aide)    Equipment Recommendations  None recommended by OT    Recommendations for Other Services       Precautions / Restrictions Precautions Precautions: Fall Precaution Comments: h/o stumbles, but pt reports no actual falls      Mobility Bed Mobility Overal bed mobility: Modified Independent                Transfers Overall transfer level: Needs assistance Equipment used: Rolling walker (2 wheeled) Transfers: Sit to/from Stand Sit to Stand: Min assist         General transfer comment: min (A) with management of Rw    Balance Overall balance assessment: Needs assistance         Standing balance support: Bilateral upper extremity supported;During functional activity Standing balance-Leahy Scale: Good                              ADL Overall ADL's : Needs assistance/impaired     Grooming: Supervision/safety;Standing    Upper Body Bathing: Supervision/ safety;Standing   Lower Body Bathing: Supervison/ safety;Sit to/from stand       Lower Body Dressing: Supervision/safety;Sit to/from stand               Functional mobility during ADLs: Minimal assistance;Rolling walker General ADL Comments: Pt verbalizing the need to home health aide to help take care of him and his wife. Reports being the main caregiver for household. pt pleasant and agreeable to session. pt reports " i feel so much better now" s/p bathing at sink. Pt gets down in the bottom of tub at home and soaks. pt allowed to soak feet in warm basin for several minutes and was very grateful.     Vision                     Perception Perception Perception Tested?: No   Praxis Praxis Praxis tested?: Not tested    Pertinent Vitals/Pain VSS     Hand Dominance Right   Extremity/Trunk Assessment Upper Extremity Assessment Upper Extremity Assessment: Generalized weakness   Lower Extremity Assessment Lower Extremity Assessment: Defer to PT evaluation   Cervical / Trunk Assessment Cervical / Trunk Assessment: Kyphotic   Communication Communication Communication: HOH   Cognition Arousal/Alertness: Awake/alert Behavior During Therapy: WFL for tasks assessed/performed Overall Cognitive Status: Within Functional Limits for tasks assessed  General Comments       Exercises       Shoulder Instructions      Home Living Family/patient expects to be discharged to:: Private residence Living Arrangements: Spouse/significant other Available Help at Discharge: Family;Available 24 hours/day Type of Home: House Home Access: Stairs to enter [employer] of Steps: 5 Entrance Stairs-Rails: Can reach both;Left;Right Home Layout: One level     Bathroom Shower/Tub: Tub/shower unit (gets down in the tub)   Bathroom Toilet: Standard     Home Equipment: Environmental consultant - 2 wheels;Cane - single  point;Wheelchair - manual   Additional Comments: Pt reports he has tried several times to get his grandson to move in with them, but he does not want to.  Pt relays wishes to have someone there with them (he and his wife) all the time.        Prior Functioning/Environment Level of Independence: Independent        Comments: Pt drives, does all the cooking and cleaning.  Grandson does the yardwork, but pt says he would if the grandson did not insist on doing it himself.     OT Diagnosis: Generalized weakness   OT Problem List: Decreased strength;Decreased activity tolerance;Impaired balance (sitting and/or standing);Decreased safety awareness;Decreased knowledge of use of DME or AE;Decreased knowledge of precautions   OT Treatment/Interventions: Self-care/ADL training;Therapeutic exercise;DME and/or AE instruction;Therapeutic activities;Patient/family education;Balance training    OT Goals(Current goals can be found in the care plan section) Acute Rehab OT Goals Patient Stated Goal: to go home OT Goal Formulation: With patient Time For Goal Achievement: 01/11/14 Potential to Achieve Goals: Good  OT Frequency: Min 2X/week   Barriers to D/C:            Co-evaluation              End of Session Equipment Utilized During Treatment: Gait belt;Rolling walker Nurse Communication: Mobility status;Precautions  Activity Tolerance: Patient tolerated treatment well Patient left: in chair;with call bell/phone within reach   Time: 0910-0947 OT Time Calculation (min): 37 min Charges:  OT General Charges $OT Visit: 1 Procedure OT Evaluation $Initial OT Evaluation Tier I: 1 Procedure OT Treatments $Self Care/Home Management : 23-37 mins G-Codes:    Peri Maris 01-23-2014, 2:33 PM Pager: [phone]

## 2013-12-29 LAB — BASIC METABOLIC PANEL
BUN: 30 mg/dL — AB (ref 6–23)
CHLORIDE: 104 meq/L (ref 96–112)
CO2: 19 meq/L (ref 19–32)
CREATININE: 2.05 mg/dL — AB (ref 0.50–1.35)
Calcium: 8.1 mg/dL — ABNORMAL LOW (ref 8.4–10.5)
GFR calc Af Amer: 30 mL/min — ABNORMAL LOW (ref >90)
GFR calc non Af Amer: 26 mL/min — ABNORMAL LOW (ref >90)
Glucose, Bld: 101 mg/dL — ABNORMAL HIGH (ref 70–99)
POTASSIUM: 4.2 meq/L (ref 3.7–5.3)
Sodium: 138 mEq/L (ref 137–147)

## 2013-12-29 LAB — GLUCOSE, CAPILLARY: Glucose-Capillary: 90 mg/dL (ref 70–99)

## 2013-12-29 NOTE — Progress Notes (Addendum)
Brett Anthony ZHG:[id number] DOB: 21-Mar-1919 DOA: 12/26/2013 PCP: No primary provider on file.  Time spent : 35 minutes  Brief narrative: 80 y.o. male with no significant Past Medical History who presented with lethargy, weight loss, poor appetite for 6 weeks. He had not seen a physician since 1982. For the past 6 weeks, patient has had malaise, weakness and poor appetite. He had been drinking a lot of liquids and Ensure. He endorsed for the past 2 days he has had intermittent loose stools-2-3 times a day. Denied any fever. He endorsed generalized myalgia for the past 2 weeks and had been taking Advil at least twice a day on a daily basis. On the morning of presentation he was weak, could not get up and was having dizziness upon attempting to stand up. As his result he was brought to the emergency room where he was found to have a creatinine of 13 with a potassium of 7.1.During the admitting physician's evaluation, patient was found to have a significantly distended bladder, a Foley catheter was placed, within a few minutes at least 800 cc of urine drained, and was continuing to drain during this dictation.   Please note, normally at baseline patient is very functional, he is very much awake and alert, is able to ambulate, and is independent of all activities of daily living.   Patient denied any nausea, vomiting. He denies any fever headache. He endorsed poor stream when he urinated and he dribbles a lot. No history of abdominal pain except for intermittent lower abdominal pain.  Per family he has had a unquantified amount of weight loss since the past 6 weeks.  HPI/Subjective: Some epistaxis, not unusual for him. Some bleeding from meatus. No dyspnea. Appetite excellent.  Assessment/Plan:    Acute renal failure due to Obstructive uropathy High residual and increased creatinine after removal of foley. replace and repeat creat.  Home with foley in am if creat ok. Urology f/u scheduled    Metabolic acidosis resolved    HTN  -BP controlled with Norvasc    Hyperkalemia -due to ARF- resolved    Hypernatremia resolved    Left bundle branch block -ECHO this admit normal noting expected finding of septal motion abnormal function, dyssynergy, and paradox which are consistent with a left bundle branch block.    Protein-calorie malnutrition, severe -cont Ensure  Epistaxis, urethral bleeding: both mild but will hold [place] heparin and change to SCDs. D/c tele. Pt likely not interested in home pt, per family  DVT prophylaxis: Subcutaneous heparin Code Status: DO NOT RESUSCITATE Family Communication: Family at bedside Disposition Plan/Expected LOS: Tlikely home in am.  Consultants: Nephrology  Procedures: None  Antibiotics: None  Objective: Blood pressure 114/43, pulse 75, temperature 98.2 F (36.8 C), temperature source Oral, resp. rate 18, height 5\' 5"  (1.651 m), weight 51.4 kg (113 lb 5.1 oz), SpO2 100.00%.  Intake/Output Summary (Last 24 hours) at 12/29/13 1647 Last data filed at 12/29/13 1230  Gross per 24 hour  Intake    480 ml  Output   2570 ml  Net  -2090 ml   Exam: General: in chair, eating ravenously. HOH Lungs: Clear to auscultation bilaterally without wheezes or crackles, RA Cardiovascular: Regular rate and rhythm without murmur gallop or rub normal S1 and S2, no peripheral edema or JVD Abdomen: Nontender, nondistended, soft, bowel sounds positive, no rebound, no ascites, no appreciable mass Musculoskeletal: No significant cyanosis, clubbing of bilateral lower extremities   Scheduled Meds:  Scheduled Meds: .  amLODipine  5 mg Oral Daily  . feeding supplement (ENSURE COMPLETE)  237 mL Oral BID BM  . sodium chloride  3 mL Intravenous Q12H  . tamsulosin  0.4 mg Oral Daily   Continuous Infusions:    Data Reviewed: Basic Metabolic Panel:  Recent Labs Lab 12/26/13 1614 12/26/13 2203 12/27/13 0244 12/28/13 0515 12/29/13 0420  NA 146 150*  150* 142 138  K 7.1* 5.0 4.2 4.0 4.2  CL 115* 117* 117* 109 104  CO2 11* 14* 15* 22 19  GLUCOSE 94 85 115* 126* 101*  BUN 110* 86* 65* 25* 30*  CREATININE 13.34* 8.03* 4.78* 1.13 2.05*  CALCIUM 9.5 9.9 9.5 8.6 8.1*  PHOS  --   --  3.6 2.1*  --    Liver Function Tests:  Recent Labs Lab 12/26/13 1614 12/27/13 0244 12/28/13 0515  AST 12  --   --   ALT 12  --   --   ALKPHOS 98  --   --   BILITOT 0.3  --   --   PROT 7.5  --   --   ALBUMIN 3.8 3.6 3.0*    Recent Labs Lab 12/26/13 1614  LIPASE 78*   CBC:  Recent Labs Lab 12/26/13 1614 12/26/13 2203 12/27/13 0244  WBC 8.8 9.3 8.2  NEUTROABS 7.0  --   --   HGB 14.0 14.2 13.6  HCT 42.1 41.9 39.7  MCV 86.1 84.6 83.1  PLT 265 254 250   Cardiac Enzymes:  Recent Labs Lab 12/26/13 1614  TROPONINI <0.30   CBG:  Recent Labs Lab 12/27/13 0822 12/28/13 0656 12/29/13 0616  GLUCAP 114* 110* 90    Recent Results (from the past 240 hour(s))  MRSA PCR SCREENING     Status: None   Collection Time    12/26/13  9:04 PM      Result Value Ref Range Status   MRSA by PCR NEGATIVE  NEGATIVE Final   Comment:            The GeneXpert MRSA Assay (FDA     approved for NASAL specimens     only), is one component of a     comprehensive MRSA colonization     surveillance program. It is not     intended to diagnose MRSA     infection nor to guide or     monitor treatment for     MRSA infections.    Doree Barthel, MD [facility] [phone]  If 7PM-7AM, please contact night-coverage [email or web address] Password [email or web address] 12/29/2013, 4:47 PM   LOS: 3 days

## 2013-12-29 NOTE — Progress Notes (Signed)
Physical Therapy Treatment Patient Details Name: Brett Anthony MRN: [id number] DOB: Dec 08, 1918 Today's Date: 2013/12/31    History of Present Illness 80 y.o. male admitted to [facility] on 12/26/13 with maliase, generalized weakness, and poor appetitie that has been getting progressively worse over a 6 week period of time.  Pt has no significant PMHx on file and has not been to a doctor since the 1980s.  Pt ultimately dx with acute renal failure, acute urinary retention, hypokalemia, metabolic acidosis, HTN, and L bundle branch block.      PT Comments    Pt doing well with mobility. Pt concerned about having to have foley.  Follow Up Recommendations  Home health PT;Other (comment) [facility] aide)     Equipment Recommendations  None recommended by PT    Recommendations for Other Services       Precautions / Restrictions Precautions Precautions: Fall    Mobility  Bed Mobility Overal bed mobility: Modified Independent             General bed mobility comments: using bed rail for leverage  Transfers Overall transfer level: Modified independent Equipment used: None Transfers: Sit to/from Stand Sit to Stand: Modified independent (Device/Increase time)            Ambulation/Gait Ambulation/Gait assistance: Modified independent (Device/Increase time) Ambulation Distance (Feet): 300 Feet Assistive device: Rolling walker (2 wheeled) Gait Pattern/deviations: Step-through pattern;Decreased stride length     General Gait Details: Used walker in hallway without any unsteadiness. In room no assistive device and no unsteadiness. Pt able to perform activities at sink without difficulty and able to carry objects in both hands while amb in room without problems.   Stairs            Wheelchair Mobility    Modified Rankin (Stroke Patients Only)       Balance           Standing balance support: No upper extremity supported;During functional activity Standing balance-Leahy  Scale: Good                      Cognition Arousal/Alertness: Awake/alert Behavior During Therapy: WFL for tasks assessed/performed Overall Cognitive Status: Within Functional Limits for tasks assessed                      Exercises      General Comments        Pertinent Vitals/Pain N/A    Home Living                      Prior Function            PT Goals (current goals can now be found in the care plan section) Progress towards PT goals: Progressing toward goals    Frequency  Min 3X/week    PT Plan Current plan remains appropriate    Co-evaluation             End of Session   Activity Tolerance: Patient tolerated treatment well Patient left: in bed;with call bell/phone within reach     Time: 0945-1010 PT Time Calculation (min): 25 min  Charges:  $Gait Training: 23-37 mins                    G CodesShary Decamp Elijan Anthony 12/31/2013, 10:23 AM  Brett Anthony PT [phone]

## 2013-12-30 DIAGNOSIS — I10 Essential (primary) hypertension: Secondary | ICD-10-CM

## 2013-12-30 LAB — BASIC METABOLIC PANEL
BUN: 26 mg/dL — AB (ref 6–23)
CHLORIDE: 107 meq/L (ref 96–112)
CO2: 21 meq/L (ref 19–32)
Calcium: 8.7 mg/dL (ref 8.4–10.5)
Creatinine, Ser: 1.28 mg/dL (ref 0.50–1.35)
GFR calc Af Amer: 54 mL/min — ABNORMAL LOW (ref >90)
GFR calc non Af Amer: 46 mL/min — ABNORMAL LOW (ref >90)
Glucose, Bld: 97 mg/dL (ref 70–99)
Potassium: 4.3 mEq/L (ref 3.7–5.3)
Sodium: 143 mEq/L (ref 137–147)

## 2013-12-30 MED ORDER — ALBUTEROL SULFATE (2.5 MG/3ML) 0.083% IN NEBU: 2.5000 mg | INHALATION_SOLUTION | RESPIRATORY_TRACT | Status: DC | PRN

## 2013-12-30 MED ORDER — AMLODIPINE BESYLATE 5 MG PO TABS: 5.0000 mg | ORAL_TABLET | Freq: Every day | ORAL | Status: DC

## 2013-12-30 MED ORDER — TAMSULOSIN HCL 0.4 MG PO CAPS: 0.4000 mg | ORAL_CAPSULE | Freq: Every day | ORAL | Status: DC

## 2013-12-30 MED ORDER — LACTULOSE 10 GM/15ML PO SOLN
20.0000 g | Freq: Once | ORAL | Status: AC
  Administered 2013-12-30: 20 g via ORAL
  Filled 2013-12-30: qty 30

## 2013-12-30 NOTE — Progress Notes (Signed)
Pt discharged home with son n law. Pt has orders for hhrn. Case mang. gve pt and son n law information for PCP. Discharge instruction reviewed prescriptions given. Pt and son n law VU. Encouraged to call with needs. Place pt foley bag to leg bag. Instructed on foley care and bag changes. Pt aware of urologist appt.

## 2013-12-30 NOTE — Discharge Summary (Signed)
Physician Discharge Summary  Brett Anthony IOM:[id number] DOB: [birth year] DOA: 12/26/2013  PCP: No primary provider on file.  Admit date: 12/26/2013 Discharge date: 12/30/2013  Time spent: 35 minutes  Recommendations for Outpatient Follow-up:  1. Please follow up on blood pressures,he is being discharged on Norvasc 5 mg PO q daily 2. Please follow up on BMP, presented with postobstructive nephropathy, creatinine improving to 1.28 on the day of discharge 3. Home Health Services ordered prior to discharge  Discharge Diagnoses:  Active Problems:   ARF (acute renal failure)   Obstructive uropathy   HTN (hypertension)   LBBB (left bundle branch block)   Hyperkalemia   Metabolic acidosis   Protein-calorie malnutrition, severe   Hypernatremia   Discharge Condition: Stable/Improved  Diet recommendation: Regular Diet  Filed Weights   12/28/13 0430 12/29/13 0443 12/30/13 0447  Weight: 57.8 kg (127 lb 6.8 oz) 51.4 kg (113 lb 5.1 oz) 50.6 kg (111 lb 8.8 oz)    History of present illness:  Brett Anthony is a 80 y.o. male with no significant Past Medical History who presents today with the above noted complaint. Patient has not seen a physician since 1982. For the past 6 weeks, patient has had malaise, weakness and poor appetite. He has been drinking a lot of liquids and Ensure. He also claims that for the past 2 days he has had intermittent loose stools-2-3 times a day. Denies any fever. He claims that he has aches and pains and for the past 2 weeks he has been taking Advil at least twice a day on a daily basis. This morning he was weak, could not get up and was having dizziness upon attempting to stand up. As his result he was brought to the emergency room where he was found to have a creatinine of 13 with a potassium of 7.1. As a result I was asked to admit this patient for further evaluation and treatment. During my evaluation, patient was found to have a significantly distended bladder, a  Foley catheter was placed, within a few minutes at least 800 cc of urine drained, and was continuing to drain during this dictation.  Please note, normally at baseline patient is very functional, he is very much awake and alert, is able to ambulate, and is independent of all activities of daily living.  Patient denies any nausea, vomiting. He denies any fever headache.  He does claim to have a poor stream when he urinates and he dribbles a lot.  No history of abdominal pain except for intermittent lower abdominal pain.  Per family he has had a unquantified amount of weight loss since the past 6 weeks   Hospital Course:  80 y.o. male with no significant Past Medical History who presented with lethargy, weight loss, poor appetite for 6 weeks. He had not seen a physician since 1982. For the past 6 weeks, patient has had malaise, weakness and poor appetite. He had been drinking a lot of liquids and Ensure. He endorsed for the past 2 days he has had intermittent loose stools-2-3 times a day. Denied any fever. He endorsed generalized myalgia for the past 2 weeks and had been taking Advil at least twice a day on a daily basis. On the morning of presentation he was weak, could not get up and was having dizziness upon attempting to stand up. As his result he was brought to the emergency room where he was found to have a creatinine of 13 with a potassium of 7.1.During the admitting  physician's evaluation, patient was found to have a significantly distended bladder, a Foley catheter was placed. Patient was seen and evaluated by Dr. Justin Mend of nephrology. Acute kidney injury attributed to postobstructive nephropathy as he was found to have distended bladder and brisk diuresis after Foley catheter placement. Patient's creatinine markedly improved and by 12/30/2013 a.m. lab work showed a creatinine 1.28 with BUN of 26. Patient was ambulating around his room, reported feeling well, tolerating by mouth intake. He was  discharged on an indwelling Foley catheter as follow up appointment with urology was made for 01/09/2014. Prior to discharge case manager consulted to establish patient with a local primary care provider for hospital followup as well. Case manager was also consulted for setting patient up with home health services with RN and physical therapy. Patient was discharged to his home in stable condition on 12/30/2013.      Consultations:  Nephrology  Discharge Exam: Filed Vitals:   12/30/13 0447  BP: 119/49  Pulse: 82  Temp: 98.2 F (36.8 C)  Resp: 18    General: Patient is in no acute distress, walking around his room. He states feeling much better and is anticipating going home today Cardiovascular: Regular rate and rhythm normal S1-S2 Respiratory: Clear to auscultation bilaterally Abdomen: Soft nontender nondistended  Discharge Instructions You were cared for by a hospitalist during your hospital stay. If you have any questions about your discharge medications or the care you received while you were in the hospital after you are discharged, you can call the unit and asked to speak with the hospitalist on call if the hospitalist that took care of you is not available. Once you are discharged, your primary care physician will handle any further medical issues. Please note that NO REFILLS for any discharge medications will be authorized once you are discharged, as it is imperative that you return to your primary care physician (or establish a relationship with a primary care physician if you do not have one) for your aftercare needs so that they can reassess your need for medications and monitor your lab values.  Discharge Orders   Future Orders Complete By Expires   Call MD for:  difficulty breathing, headache or visual disturbances  As directed    Call MD for:  extreme fatigue  As directed    Call MD for:  persistant dizziness or light-headedness  As directed    Call MD for:  persistant  nausea and vomiting  As directed    Call MD for:  severe uncontrolled pain  As directed    Diet - low sodium heart healthy  As directed    Discharge instructions  As directed    Increase activity slowly  As directed        Medication List         albuterol (2.5 MG/3ML) 0.083% nebulizer solution  Commonly known as:  PROVENTIL  Take 3 mLs (2.5 mg total) by nebulization every 2 (two) hours as needed for wheezing.     amLODipine 5 MG tablet  Commonly known as:  NORVASC  Take 1 tablet (5 mg total) by mouth daily.       No Known Allergies     Follow-up Information   Follow up with [facility] On 01/09/2014. (0800 with Dr. Tresa Moore)    Contact information:   [place] [address] [phone]        The results of significant diagnostics from this hospitalization (including  imaging, microbiology, ancillary and laboratory) are listed below for reference.    Significant Diagnostic Studies: Dg Chest Port 1 View  12/26/2013   CLINICAL DATA:  Weight loss and diarrhea with loss of appetite  EXAM: PORTABLE CHEST - 1 VIEW  COMPARISON:  None.  FINDINGS: The lungs are well-expanded. The interstitial markings are increased bilaterally. There is no alveolar infiltrate. There is no pleural effusion or pneumothorax. The cardiopericardial silhouette is normal in size. The pulmonary vascularity is not engorged. The mediastinum is normal in width. The observed portions of the bony thorax exhibit no acute abnormalities.  IMPRESSION: There is no focal pneumonia nor evidence of CHF. Prominence of the pulmonary interstitial markings is nonspecific. Given the lack of any cardiopulmonary symptoms this is likely chronic. A followup PA and lateral chest x-ray would be of value when the patient can tolerate the procedure.   Electronically Signed   By: David  Martinique   On: 12/26/2013 18:07    Microbiology: Recent Results (from the past 240 hour(s))  MRSA PCR SCREENING      Status: None   Collection Time    12/26/13  9:04 PM      Result Value Ref Range Status   MRSA by PCR NEGATIVE  NEGATIVE Final   Comment:            The GeneXpert MRSA Assay (FDA     approved for NASAL specimens     only), is one component of a     comprehensive MRSA colonization     surveillance program. It is not     intended to diagnose MRSA     infection nor to guide or     monitor treatment for     MRSA infections.     Labs: Basic Metabolic Panel:  Recent Labs Lab 12/26/13 2203 12/27/13 0244 12/28/13 0515 12/29/13 0420 12/30/13 0407  NA 150* 150* 142 138 143  K 5.0 4.2 4.0 4.2 4.3  CL 117* 117* 109 104 107  CO2 14* 15* 22 19 21   GLUCOSE 85 115* 126* 101* 97  BUN 86* 65* 25* 30* 26*  CREATININE 8.03* 4.78* 1.13 2.05* 1.28  CALCIUM 9.9 9.5 8.6 8.1* 8.7  PHOS  --  3.6 2.1*  --   --    Liver Function Tests:  Recent Labs Lab 12/26/13 1614 12/27/13 0244 12/28/13 0515  AST 12  --   --   ALT 12  --   --   ALKPHOS 98  --   --   BILITOT 0.3  --   --   PROT 7.5  --   --   ALBUMIN 3.8 3.6 3.0*    Recent Labs Lab 12/26/13 1614  LIPASE 78*   No results found for this basename: AMMONIA,  in the last 168 hours CBC:  Recent Labs Lab 12/26/13 1614 12/26/13 2203 12/27/13 0244  WBC 8.8 9.3 8.2  NEUTROABS 7.0  --   --   HGB 14.0 14.2 13.6  HCT 42.1 41.9 39.7  MCV 86.1 84.6 83.1  PLT 265 254 250   Cardiac Enzymes:  Recent Labs Lab 12/26/13 1614  TROPONINI <0.30   BNP: BNP (last 3 results) No results found for this basename: PROBNP,  in the last 8760 hours CBG:  Recent Labs Lab 12/27/13 0822 12/28/13 0656 12/29/13 0616  GLUCAP 114* 110* 90       Signed:  [facility] 12/30/2013, 10:05 AM

## 2014-01-30 ENCOUNTER — Telehealth: Payer: Self-pay | Admitting: Physician Assistant

## 2014-01-30 NOTE — Telephone Encounter (Signed)
Pt will est w/ mat Tucker when his schedule opens up. Faythe Dingwall is son who will bring pt to appts. Spoke w/ pt's son today.

## 2014-02-07 ENCOUNTER — Other Ambulatory Visit (HOSPITAL_COMMUNITY): Payer: Self-pay | Admitting: Urology

## 2014-02-07 DIAGNOSIS — T447X1A Poisoning by beta-adrenoreceptor antagonists, accidental (unintentional), initial encounter: Secondary | ICD-10-CM

## 2014-02-07 DIAGNOSIS — C61 Malignant neoplasm of prostate: Secondary | ICD-10-CM

## 2014-02-16 ENCOUNTER — Ambulatory Visit (HOSPITAL_COMMUNITY)
Admission: RE | Admit: 2014-02-16 | Discharge: 2014-02-16 | Disposition: A | Discharge status: Home / Self Care | Payer: Medicare Other | Source: Ambulatory Visit | Attending: Urology | Admitting: Urology

## 2014-02-16 ENCOUNTER — Encounter (HOSPITAL_COMMUNITY): Payer: Self-pay

## 2014-02-16 ENCOUNTER — Encounter (HOSPITAL_COMMUNITY)
Admission: RE | Admit: 2014-02-16 | Discharge: 2014-02-16 | Disposition: A | Discharge status: Home / Self Care | Payer: Medicare Other | Source: Ambulatory Visit | Attending: Urology | Admitting: Urology

## 2014-02-16 DIAGNOSIS — C61 Malignant neoplasm of prostate: Secondary | ICD-10-CM | POA: Insufficient documentation

## 2014-02-16 HISTORY — DX: Malignant neoplasm of prostate: C61

## 2014-02-16 MED ORDER — TECHNETIUM TC 99M MEDRONATE IV KIT
27.0000 | PACK | Freq: Once | INTRAVENOUS | Status: AC | PRN
  Administered 2014-02-16: 27 via INTRAVENOUS

## 2014-02-20 ENCOUNTER — Other Ambulatory Visit: Payer: Self-pay | Admitting: Urology

## 2014-02-27 ENCOUNTER — Encounter (HOSPITAL_COMMUNITY): Payer: Self-pay | Admitting: Pharmacy Technician

## 2014-02-28 ENCOUNTER — Telehealth: Payer: Self-pay | Admitting: General Practice

## 2014-02-28 NOTE — Telephone Encounter (Signed)
Pt is on the list to schedule w/ matt. However, son called today and did not realize that pt is out of his amLODipine (NORVASC) 5 MG tablet and flomax which were rx to him the last time he was in the hospital.  Son asked if we can get him in tomorrow for an appt.   Pt never been seen here yet.  Otherwise, they do not know what to do. pls advise! Wife sees dr Maudie Mercury.  Pt moved here a few months ago and had been heathly until an issue w/ his prostate came up.  Pt is having prostate surgery 7/11.

## 2014-02-28 NOTE — Telephone Encounter (Signed)
Dr Maudie Mercury, You see this pt's wife.  Pt is out of amlodipine  And flomax.  These are his only 2 meds. Would you see this pt and get him his  meds ?  Son is Database administrator. [facility] will not see a 80 yr old.Marland Kitchen

## 2014-03-01 ENCOUNTER — Emergency Department (INDEPENDENT_AMBULATORY_CARE_PROVIDER_SITE_OTHER)
Admission: EM | Admit: 2014-03-01 | Discharge: 2014-03-01 | Disposition: A | Discharge status: Home / Self Care | Payer: Medicare Other | Source: Home / Self Care | Attending: Family Medicine | Admitting: Family Medicine

## 2014-03-01 ENCOUNTER — Encounter (HOSPITAL_COMMUNITY): Payer: Self-pay | Admitting: Emergency Medicine

## 2014-03-01 DIAGNOSIS — I10 Essential (primary) hypertension: Secondary | ICD-10-CM

## 2014-03-01 DIAGNOSIS — Z760 Encounter for issue of repeat prescription: Secondary | ICD-10-CM

## 2014-03-01 DIAGNOSIS — N400 Enlarged prostate without lower urinary tract symptoms: Secondary | ICD-10-CM

## 2014-03-01 MED ORDER — AMLODIPINE BESYLATE 5 MG PO TABS: 5.0000 mg | ORAL_TABLET | Freq: Every day | ORAL | Status: DC

## 2014-03-01 MED ORDER — TAMSULOSIN HCL 0.4 MG PO CAPS: 0.4000 mg | ORAL_CAPSULE | Freq: Every day | ORAL | Status: DC

## 2014-03-01 NOTE — Telephone Encounter (Signed)
Unfortunately my next new patient visit is not until next year. They may wish to establish with Dr. Yong Channel if [facility] is not able to except him as a patient. Thanks.

## 2014-03-01 NOTE — ED Provider Notes (Signed)
CSN: [id number]     Arrival date & time 03/01/14  1200 History   First MD Initiated Contact with Patient 03/01/14 1228     Chief Complaint  Patient presents with  . Medication Refill   (Consider location/radiation/quality/duration/timing/severity/associated sxs/prior Treatment) HPI Comments: Is transitioning to a new PCP but will run out of Flomax and Amlodipine before initial visit on 04/12/2014 and is here to request Rx refills to carry him until PCP appointment in August 2015.   The history is provided by the patient, a relative and a caregiver.    Past Medical History  Diagnosis Date  . Prostate cancer    Past Surgical History  Procedure Laterality Date  . Cholecystectomy     No family history on file. History  Substance Use Topics  . Smoking status: Never Smoker   . Smokeless tobacco: Not on file  . Alcohol Use: No    Review of Systems  All other systems reviewed and are negative.   Allergies  Review of patient's allergies indicates no known allergies.  Home Medications   Prior to Admission medications   Medication Sig Start Date End Date Taking? Authorizing Provider  amLODipine (NORVASC) 5 MG tablet Take 5 mg by mouth every morning.   Yes Historical Provider, MD  tamsulosin (FLOMAX) 0.4 MG CAPS capsule Take 0.4 mg by mouth daily after breakfast.   Yes Historical Provider, MD  albuterol (PROVENTIL HFA;VENTOLIN HFA) 108 (90 BASE) MCG/ACT inhaler Inhale 1 puff into the lungs every 6 (six) hours as needed for wheezing or shortness of breath.    Historical Provider, MD  amLODipine (NORVASC) 5 MG tablet Take 1 tablet (5 mg total) by mouth daily. 03/01/14   Lahoma Rocker, PA  ibuprofen (ADVIL,MOTRIN) 200 MG tablet Take 400 mg by mouth every 6 (six) hours as needed for mild pain or moderate pain.    Historical Provider, MD  tamsulosin (FLOMAX) 0.4 MG CAPS capsule Take 1 capsule (0.4 mg total) by mouth daily. 03/01/14   Annett Gula Deondria Puryear, PA   BP 154/78  Pulse 74   Temp(Src) 98.2 F (36.8 C) (Oral)  Resp 18  SpO2 98% Physical Exam  Nursing note and vitals reviewed. Constitutional: He is oriented to person, place, and time. He appears well-developed and well-nourished. No distress.  HENT:  Head: Normocephalic and atraumatic.  Eyes: Conjunctivae are normal. No scleral icterus.  Cardiovascular: Normal rate.   Pulmonary/Chest: Effort normal.  Musculoskeletal: Normal range of motion.  Neurological: He is alert and oriented to person, place, and time.  Skin: Skin is warm and dry.  Psychiatric: He has a normal mood and affect. His behavior is normal.    ED Course  Procedures (including critical care time) Labs Review Labs Reviewed - No data to display  Imaging Review No results found.   MDM   1. Encounter for medication refill   Provided patient with 60 day supply of amlodipine and flomax to last until he can meet with his new PCP.    [place], [place] 03/01/14 1248

## 2014-03-01 NOTE — ED Notes (Signed)
Medication refill.  Patient's last scripts for medicines were provided by the hospital when discharged.  Patient has been waiting for first visit at [facility] (pcp).  This practice called and reported the specific provider no longer involved in elder care.  Now patient has been rescheduled for 8/13, but running out of medicine

## 2014-03-01 NOTE — Telephone Encounter (Signed)
I advise a new patient visit with Dr. Yong Channel at next available and acute with any provider for refills only. I am getting requests daily to take geriatric new patients and have limited availability and do not think it is fair to make exceptions for some and not for others. Thanks.

## 2014-03-01 NOTE — Telephone Encounter (Addendum)
Thank you. However, Dr Yong Channel doesn't have anything until Sept, and pt is out of his meds today. Guess we were hoping you could see for a new  Acute and then establish later. Even though it isn't "acute" , it is to them since he has no meds.  Thank you.

## 2014-03-01 NOTE — Patient Instructions (Addendum)
[place]  03/05/2014   Your procedure is scheduled on: Wednesday 03/14/14  Report to [facility] at 1:15 pm  Call this number if you have problems the morning of surgery [phone]   Remember:   Do not eat food After Midnight, clear liquid from midnight until 09:15 am on 03/14/14     Take these medicines the morning of surgery with A SIP OF WATER: amlodipine   Do not wear jewelry, make-up or nail polish.  Do not wear lotions, powders, or perfumes. You may wear deodorant.  Do not shave 48 hours prior to surgery. Men may shave face and neck.  Do not bring valuables to the hospital.  Contacts, dentures or bridgework may not be worn into surgery.  Leave suitcase in the car. After surgery it may be brought to your room.  For patients admitted to the hospital, checkout time is 11:00 AM the day of discharge.  Paulette Blanch, RN  pre op nurse call if needed [phone]    CLEAR LIQUID DIET   Foods Allowed                                                                     Foods Excluded  Coffee and tea, regular and decaf                             liquids that you cannot  Plain Jell-O in any flavor                                             see through such as: Fruit ices (not with fruit pulp)                                     milk, soups, orange juice  Iced Popsicles                                    All solid food Carbonated beverages, regular and diet                                    Cranberry, grape and apple juices Sports drinks like Gatorade Lightly seasoned clear broth or consume(fat free) Sugar, honey syrup  Sample Menu Breakfast                                Lunch                                     Supper Cranberry juice                    Beef broth  Chicken broth Jell-O                                     Grape juice                           Apple juice Coffee or tea                        Jell-O                                       Popsicle                                                Coffee or tea                        Coffee or tea  _____________________________________________________________________  [facility] - Preparing for Surgery Before surgery, you can play an important role.  Because skin is not sterile, your skin needs to be as free of germs as possible.  You can reduce the number of germs on your skin by washing with CHG (chlorahexidine gluconate) soap before surgery.  CHG is an antiseptic cleaner which kills germs and bonds with the skin to continue killing germs even after washing. Please DO NOT use if you have an allergy to CHG or antibacterial soaps.  If your skin becomes reddened/irritated stop using the CHG and inform your nurse when you arrive at Short Stay. Do not shave (including legs and underarms) for at least 48 hours prior to the first CHG shower.  You may shave your face/neck. Please follow these instructions carefully:  1.  Shower with CHG Soap the night before surgery and the  morning of Surgery.  2.  If you choose to wash your hair, wash your hair first as usual with your  normal  shampoo.  3.  After you shampoo, rinse your hair and body thoroughly to remove the  shampoo.                            4.  Use CHG as you would any other liquid soap.  You can apply chg directly  to the skin and wash                       Gently with a scrungie or clean washcloth.  5.  Apply the CHG Soap to your body ONLY FROM THE NECK DOWN.   Do not use on face/ open                           Wound or open sores. Avoid contact with eyes, ears mouth and genitals (private parts).                       Wash face,  Genitals (private parts) with your normal soap.             6.  Wash thoroughly, paying special attention to the  area where your surgery  will be performed.  7.  Thoroughly rinse your body with warm water from the neck down.  8.  DO NOT shower/wash with your normal soap after using  and rinsing off  the CHG Soap.                9.  Pat yourself dry with a clean towel.            10.  Wear clean pajamas.            11.  Place clean sheets on your bed the night of your first shower and do not  sleep with pets. Day of Surgery : Do not apply any lotions/deodorants the morning of surgery.  Please wear clean clothes to the hospital/surgery center.  FAILURE TO FOLLOW THESE INSTRUCTIONS MAY RESULT IN THE CANCELLATION OF YOUR SURGERY PATIENT SIGNATURE_________________________________  NURSE SIGNATURE__________________________________  ________________________________________________________________________

## 2014-03-01 NOTE — Telephone Encounter (Signed)
Pt has been scheduled w/ dr Retail banker and will go to [facility] for refills on his meds.

## 2014-03-01 NOTE — Discharge Instructions (Signed)
Medication Refill, Emergency Department We have refilled your medication today as a courtesy to you. It is best for your medical care, however, to take care of getting refills done through your primary caregiver's office. They have your records and can do a better job of follow-up than we can in the emergency department. On maintenance medications, we often only prescribe enough medications to get you by until you are able to see your regular caregiver. This is a more expensive way to refill medications. In the future, please plan for refills so that you will not have to use the emergency department for this. Thank you for your help. Your help allows [place] to better take care of the daily emergencies that enter our department. Document Released: 12/04/2003 Document Revised: 11/09/2011 Document Reviewed: 08/17/2005 [facility] Patient Information 2015 [place], [place]. This information is not intended to replace advice given to you by your health care provider. Make sure you discuss any questions you have with your health care provider.

## 2014-03-02 NOTE — ED Provider Notes (Signed)
Medical screening examination/treatment/procedure(s) were performed by a resident physician or non-physician practitioner and as the supervising physician I was immediately available for consultation/collaboration.  Lynne Leader, MD    Gregor Hams, MD 03/02/14 [phone]

## 2014-03-05 ENCOUNTER — Encounter (HOSPITAL_COMMUNITY)
Admission: RE | Admit: 2014-03-05 | Discharge: 2014-03-05 | Disposition: A | Discharge status: Home / Self Care | Payer: Medicare Other | Source: Ambulatory Visit | Attending: Urology | Admitting: Urology

## 2014-03-05 ENCOUNTER — Other Ambulatory Visit: Payer: Self-pay

## 2014-03-05 ENCOUNTER — Ambulatory Visit (HOSPITAL_COMMUNITY)
Admission: RE | Admit: 2014-03-05 | Discharge: 2014-03-05 | Disposition: A | Discharge status: Home / Self Care | Payer: Medicare Other | Source: Ambulatory Visit | Attending: Anesthesiology | Admitting: Anesthesiology

## 2014-03-05 ENCOUNTER — Encounter (HOSPITAL_COMMUNITY): Payer: Self-pay

## 2014-03-05 DIAGNOSIS — R918 Other nonspecific abnormal finding of lung field: Secondary | ICD-10-CM | POA: Insufficient documentation

## 2014-03-05 DIAGNOSIS — Z01810 Encounter for preprocedural cardiovascular examination: Secondary | ICD-10-CM | POA: Diagnosis not present

## 2014-03-05 DIAGNOSIS — Z01818 Encounter for other preprocedural examination: Secondary | ICD-10-CM | POA: Insufficient documentation

## 2014-03-05 DIAGNOSIS — Z01812 Encounter for preprocedural laboratory examination: Secondary | ICD-10-CM | POA: Diagnosis not present

## 2014-03-05 HISTORY — DX: Unspecified cataract: H26.9

## 2014-03-05 HISTORY — DX: Unspecified osteoarthritis, unspecified site: M19.90

## 2014-03-05 LAB — CBC
HEMATOCRIT: 45.3 % (ref 39.0–52.0)
Hemoglobin: 14.4 g/dL (ref 13.0–17.0)
MCH: 28.6 pg (ref 26.0–34.0)
MCHC: 31.8 g/dL (ref 30.0–36.0)
MCV: 90.1 fL (ref 78.0–100.0)
PLATELETS: 301 10*3/uL (ref 150–400)
RBC: 5.03 MIL/uL (ref 4.22–5.81)
RDW: 14.3 % (ref 11.5–15.5)
WBC: 8.6 10*3/uL (ref 4.0–10.5)

## 2014-03-05 LAB — BASIC METABOLIC PANEL
ANION GAP: 11 (ref 5–15)
BUN: 21 mg/dL (ref 6–23)
CHLORIDE: 104 meq/L (ref 96–112)
CO2: 27 mEq/L (ref 19–32)
CREATININE: 1 mg/dL (ref 0.50–1.35)
Calcium: 10 mg/dL (ref 8.4–10.5)
GFR calc Af Amer: 73 mL/min — ABNORMAL LOW (ref >90)
GFR calc non Af Amer: 63 mL/min — ABNORMAL LOW (ref >90)
Glucose, Bld: 102 mg/dL — ABNORMAL HIGH (ref 70–99)
Potassium: 4.9 mEq/L (ref 3.7–5.3)
Sodium: 142 mEq/L (ref 137–147)

## 2014-03-05 IMAGING — CR DG CHEST 2V
2 series · 2 of 2 positions shown · non-contrast
Comparison: None.

CLINICAL DATA: Preoperative respiratory films.

EXAM:
CHEST  2 VIEW

[w chest pa]
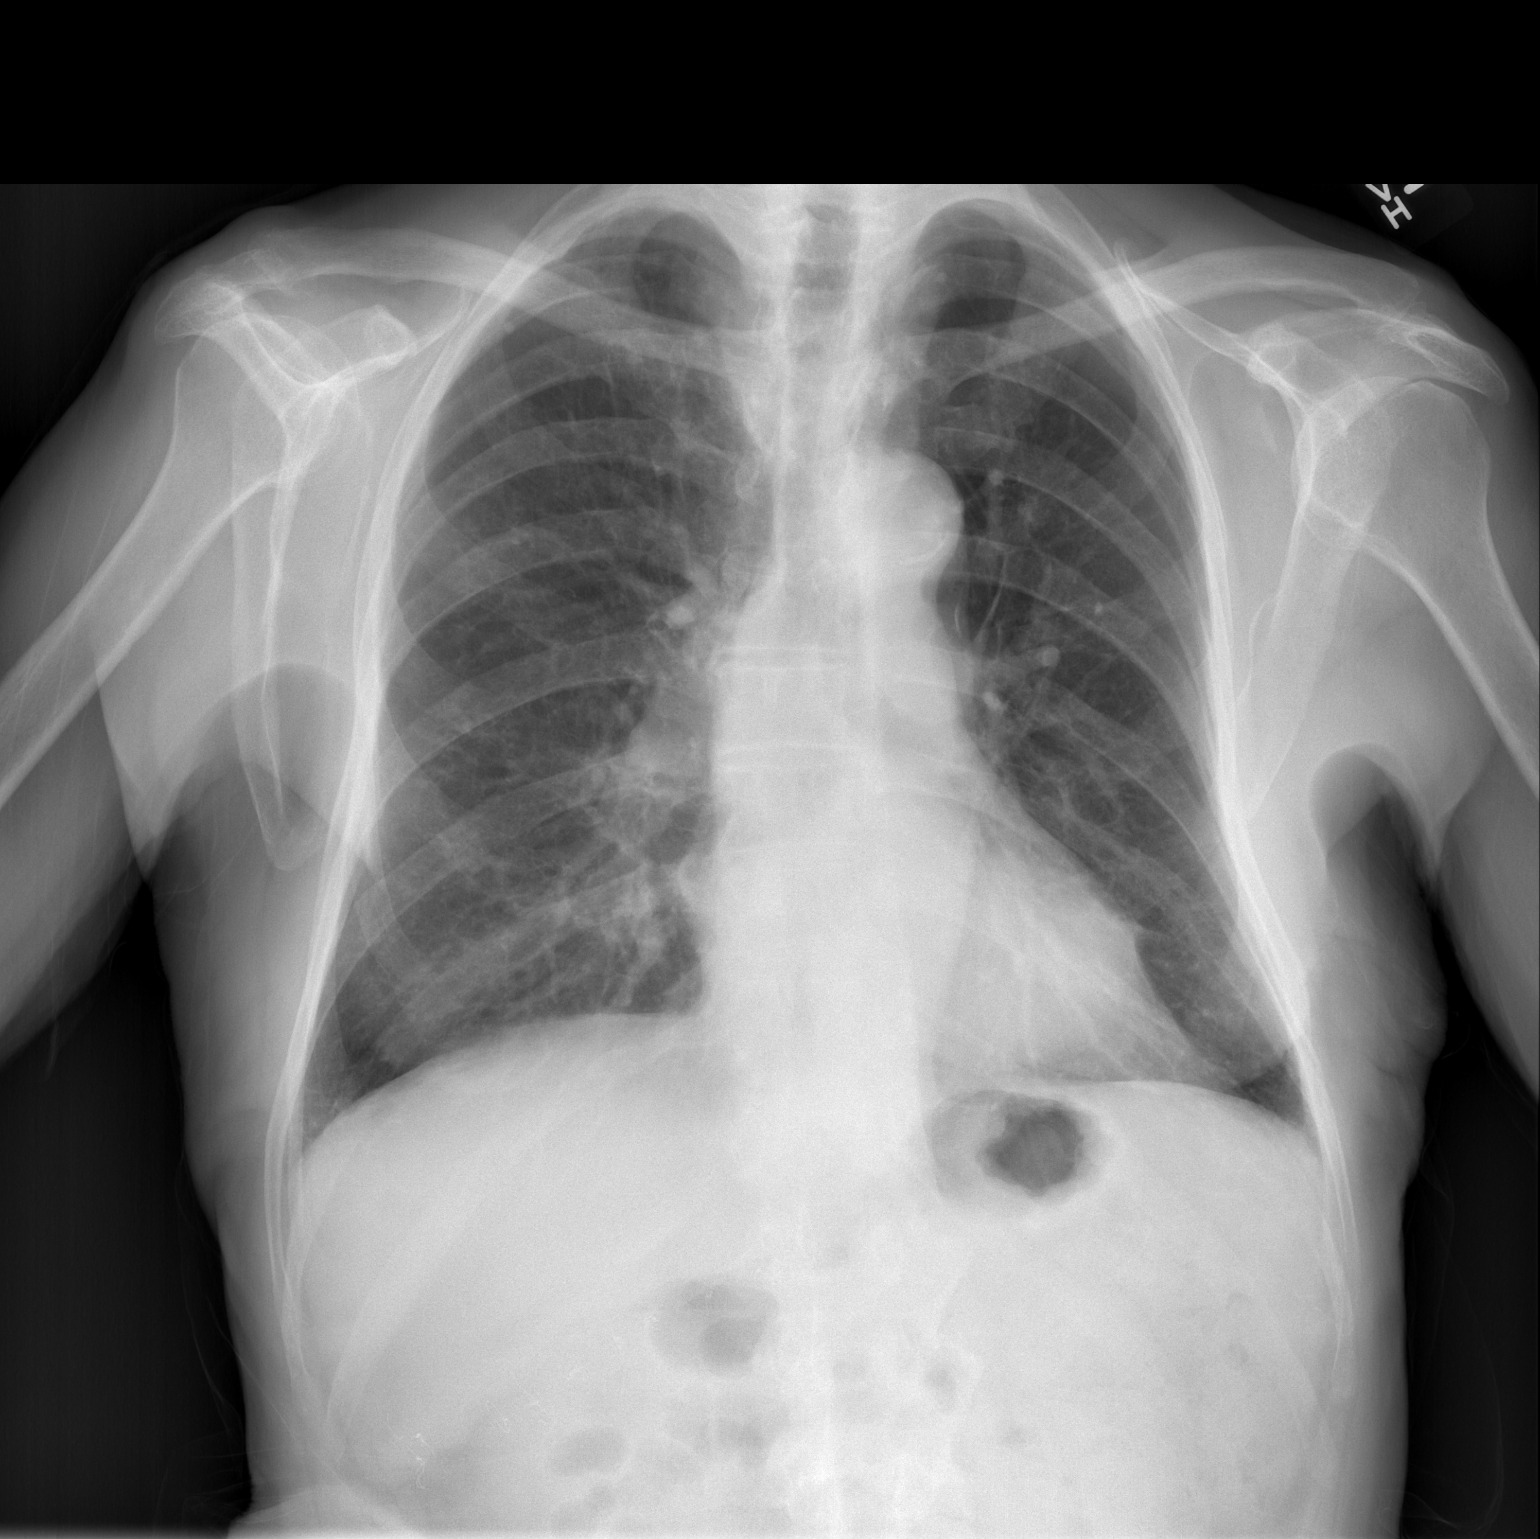

[w chest lat]
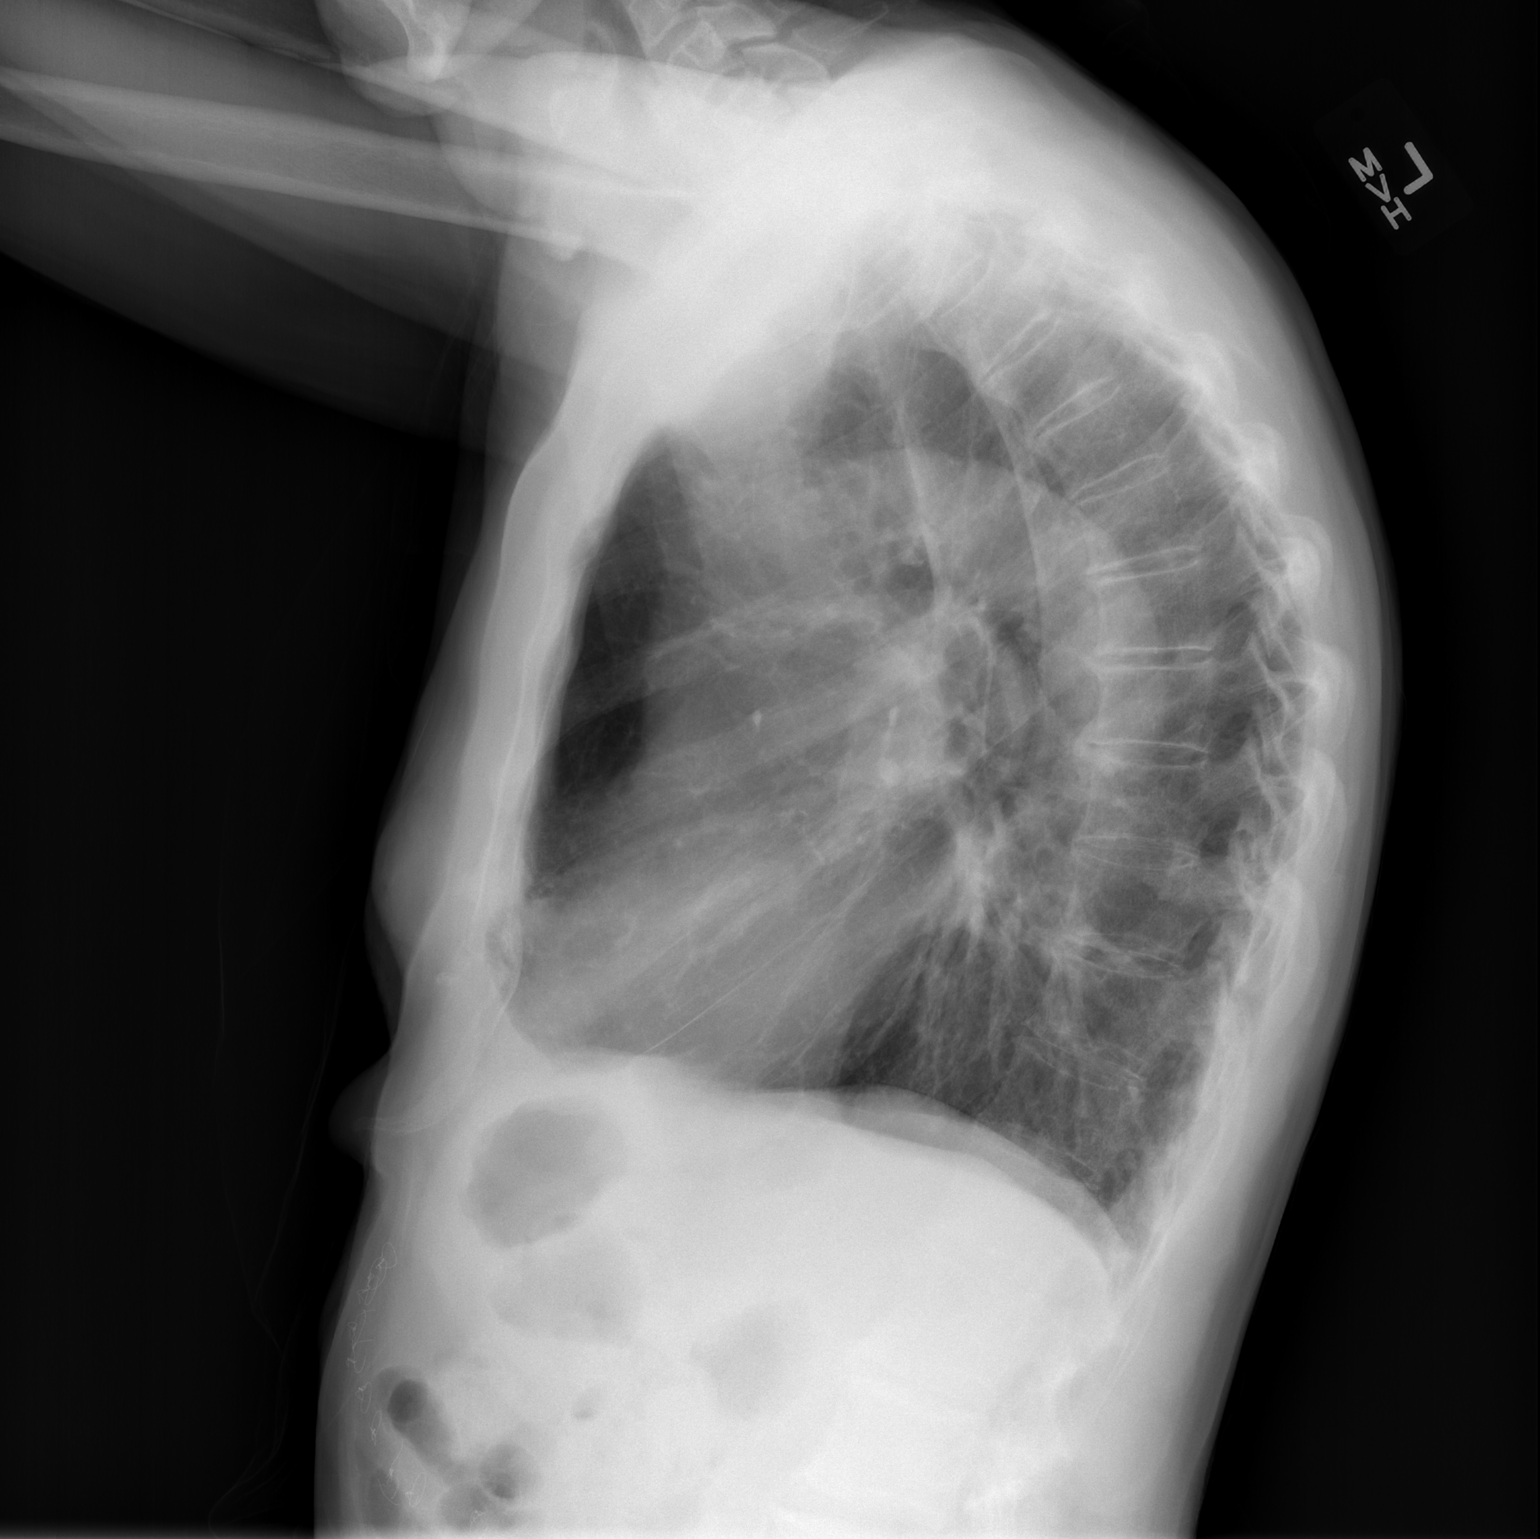

[2 of 2 positions shown; findings below may reference images not displayed]

FINDINGS: The chest is hyperexpanded with flattening of the hemidiaphragms and
enlargement of the retrosternal airspace identified. Heart size is
normal and the lungs are clear. No pneumothorax or pleural effusion.
IMPRESSION: Findings compatible with emphysema.  No acute disease.

## 2014-03-05 NOTE — Progress Notes (Signed)
EKG 12/26/13 on EPIC-repeated 03/05/14 on chart, ECHO 4/15 on EPIC

## 2014-03-14 ENCOUNTER — Encounter (HOSPITAL_COMMUNITY): Payer: Medicare Other | Admitting: Anesthesiology

## 2014-03-14 ENCOUNTER — Encounter (HOSPITAL_COMMUNITY): Payer: Self-pay | Admitting: *Deleted

## 2014-03-14 ENCOUNTER — Observation Stay (HOSPITAL_COMMUNITY)
Admission: RE | Admit: 2014-03-14 | Discharge: 2014-03-15 | Disposition: A | Discharge status: Home / Self Care | Payer: Medicare Other | Source: Ambulatory Visit | Attending: Urology | Admitting: Urology

## 2014-03-14 ENCOUNTER — Encounter (HOSPITAL_COMMUNITY)
Admission: RE | Disposition: A | Discharge status: Home / Self Care | Payer: Self-pay | Source: Ambulatory Visit | Attending: Urology

## 2014-03-14 ENCOUNTER — Ambulatory Visit (HOSPITAL_COMMUNITY): Payer: Medicare Other | Admitting: Anesthesiology

## 2014-03-14 DIAGNOSIS — C7951 Secondary malignant neoplasm of bone: Secondary | ICD-10-CM | POA: Insufficient documentation

## 2014-03-14 DIAGNOSIS — E669 Obesity, unspecified: Secondary | ICD-10-CM | POA: Diagnosis not present

## 2014-03-14 DIAGNOSIS — I10 Essential (primary) hypertension: Secondary | ICD-10-CM | POA: Insufficient documentation

## 2014-03-14 DIAGNOSIS — C61 Malignant neoplasm of prostate: Principal | ICD-10-CM | POA: Diagnosis present

## 2014-03-14 DIAGNOSIS — N401 Enlarged prostate with lower urinary tract symptoms: Secondary | ICD-10-CM

## 2014-03-14 DIAGNOSIS — R918 Other nonspecific abnormal finding of lung field: Secondary | ICD-10-CM | POA: Diagnosis not present

## 2014-03-14 DIAGNOSIS — N138 Other obstructive and reflux uropathy: Secondary | ICD-10-CM | POA: Diagnosis not present

## 2014-03-14 DIAGNOSIS — C7952 Secondary malignant neoplasm of bone marrow: Secondary | ICD-10-CM

## 2014-03-14 DIAGNOSIS — N289 Disorder of kidney and ureter, unspecified: Secondary | ICD-10-CM | POA: Diagnosis not present

## 2014-03-14 DIAGNOSIS — Z87891 Personal history of nicotine dependence: Secondary | ICD-10-CM | POA: Insufficient documentation

## 2014-03-14 DIAGNOSIS — Z9089 Acquired absence of other organs: Secondary | ICD-10-CM | POA: Insufficient documentation

## 2014-03-14 DIAGNOSIS — R339 Retention of urine, unspecified: Secondary | ICD-10-CM | POA: Diagnosis not present

## 2014-03-14 HISTORY — PX: TRANSURETHRAL RESECTION OF PROSTATE: SHX73

## 2014-03-14 HISTORY — PX: ORCHIECTOMY: SHX2116

## 2014-03-14 HISTORY — DX: Essential (primary) hypertension: I10

## 2014-03-14 SURGERY — TRANSURETHRAL RESECTION OF THE PROSTATE WITH GYRUS INSTRUMENTS
Anesthesia: General

## 2014-03-14 MED ORDER — GENTAMICIN SULFATE 40 MG/ML IJ SOLN
260.0000 mg | Freq: Once | INTRAVENOUS | Status: AC
  Administered 2014-03-14: 260 mg via INTRAVENOUS
  Filled 2014-03-14: qty 6.5

## 2014-03-14 MED ORDER — MEPERIDINE HCL 50 MG/ML IJ SOLN: 6.2500 mg | INTRAMUSCULAR | Status: DC | PRN

## 2014-03-14 MED ORDER — LIDOCAINE HCL (CARDIAC) 20 MG/ML IV SOLN
INTRAVENOUS | Status: AC
  Filled 2014-03-14: qty 5

## 2014-03-14 MED ORDER — ACETAMINOPHEN 10 MG/ML IV SOLN
1000.0000 mg | Freq: Once | INTRAVENOUS | Status: DC
  Filled 2014-03-14: qty 100

## 2014-03-14 MED ORDER — FENTANYL CITRATE 0.05 MG/ML IJ SOLN
INTRAMUSCULAR | Status: DC | PRN
  Administered 2014-03-14 (×3): 50 ug via INTRAVENOUS

## 2014-03-14 MED ORDER — SENNA 8.6 MG PO TABS
1.0000 | ORAL_TABLET | Freq: Two times a day (BID) | ORAL | Status: DC
  Administered 2014-03-14: 8.6 mg via ORAL
  Filled 2014-03-14: qty 1

## 2014-03-14 MED ORDER — KCL IN DEXTROSE-NACL 20-5-0.45 MEQ/L-%-% IV SOLN
INTRAVENOUS | Status: DC
  Administered 2014-03-14: 21:00:00 via INTRAVENOUS
  Filled 2014-03-14 (×3): qty 1000

## 2014-03-14 MED ORDER — PROMETHAZINE HCL 25 MG/ML IJ SOLN
6.2500 mg | INTRAMUSCULAR | Status: DC | PRN
Start: 2014-03-14 — End: 2014-03-14

## 2014-03-14 MED ORDER — FENTANYL CITRATE 0.05 MG/ML IJ SOLN
INTRAMUSCULAR | Status: AC
  Filled 2014-03-14: qty 2

## 2014-03-14 MED ORDER — DOCUSATE SODIUM 100 MG PO CAPS
100.0000 mg | ORAL_CAPSULE | Freq: Two times a day (BID) | ORAL | Status: DC
  Administered 2014-03-14 – 2014-03-15 (×2): 100 mg via ORAL
  Filled 2014-03-14 (×3): qty 1

## 2014-03-14 MED ORDER — HYDROMORPHONE HCL PF 1 MG/ML IJ SOLN: 0.5000 mg | INTRAMUSCULAR | Status: DC | PRN

## 2014-03-14 MED ORDER — SODIUM CHLORIDE 0.9 % IR SOLN
Status: DC | PRN
  Administered 2014-03-14: 18000 mL

## 2014-03-14 MED ORDER — OXYCODONE HCL 5 MG PO TABS: 5.0000 mg | ORAL_TABLET | Freq: Once | ORAL | Status: DC | PRN

## 2014-03-14 MED ORDER — TRAMADOL HCL 50 MG PO TABS: 50.0000 mg | ORAL_TABLET | Freq: Four times a day (QID) | ORAL | Status: DC | PRN

## 2014-03-14 MED ORDER — HYDROMORPHONE HCL PF 1 MG/ML IJ SOLN: 0.2500 mg | INTRAMUSCULAR | Status: DC | PRN

## 2014-03-14 MED ORDER — 0.9 % SODIUM CHLORIDE (POUR BTL) OPTIME
TOPICAL | Status: DC | PRN
  Administered 2014-03-14: 1000 mL

## 2014-03-14 MED ORDER — ACETAMINOPHEN 10 MG/ML IV SOLN
INTRAVENOUS | Status: DC | PRN
  Administered 2014-03-14: 1000 mg via INTRAVENOUS

## 2014-03-14 MED ORDER — ALBUTEROL SULFATE (2.5 MG/3ML) 0.083% IN NEBU: 2.5000 mg | INHALATION_SOLUTION | Freq: Four times a day (QID) | RESPIRATORY_TRACT | Status: DC | PRN

## 2014-03-14 MED ORDER — CLINDAMYCIN PHOSPHATE 600 MG/50ML IV SOLN
INTRAVENOUS | Status: DC | PRN
  Administered 2014-03-14: 600 mg via INTRAVENOUS

## 2014-03-14 MED ORDER — BUPIVACAINE HCL (PF) 0.25 % IJ SOLN
INTRAMUSCULAR | Status: AC
  Filled 2014-03-14: qty 30

## 2014-03-14 MED ORDER — ONDANSETRON HCL 4 MG/2ML IJ SOLN: 4.0000 mg | INTRAMUSCULAR | Status: DC | PRN

## 2014-03-14 MED ORDER — PROPOFOL 10 MG/ML IV BOLUS
INTRAVENOUS | Status: DC | PRN
  Administered 2014-03-14 (×2): 25 mg via INTRAVENOUS
  Administered 2014-03-14: 150 mg via INTRAVENOUS

## 2014-03-14 MED ORDER — OXYCODONE HCL 5 MG/5ML PO SOLN
5.0000 mg | Freq: Once | ORAL | Status: DC | PRN
  Filled 2014-03-14: qty 5

## 2014-03-14 MED ORDER — LIDOCAINE HCL (CARDIAC) 20 MG/ML IV SOLN
INTRAVENOUS | Status: DC | PRN
  Administered 2014-03-14: 50 mg via INTRAVENOUS

## 2014-03-14 MED ORDER — LACTATED RINGERS IV SOLN
INTRAVENOUS | Status: DC | PRN
  Administered 2014-03-14 (×2): via INTRAVENOUS

## 2014-03-14 MED ORDER — CLINDAMYCIN PHOSPHATE 600 MG/50ML IV SOLN
600.0000 mg | Freq: Once | INTRAVENOUS | Status: DC
Start: 2014-03-14 — End: 2014-03-14
  Filled 2014-03-14: qty 50

## 2014-03-14 MED ORDER — BUPIVACAINE HCL (PF) 0.25 % IJ SOLN
INTRAMUSCULAR | Status: DC | PRN
  Administered 2014-03-14: 10 mL

## 2014-03-14 MED ORDER — EPHEDRINE SULFATE 50 MG/ML IJ SOLN
INTRAMUSCULAR | Status: DC | PRN
  Administered 2014-03-14 (×6): 5 mg via INTRAVENOUS

## 2014-03-14 MED ORDER — ACETAMINOPHEN 500 MG PO TABS
1000.0000 mg | ORAL_TABLET | Freq: Three times a day (TID) | ORAL | Status: DC
  Administered 2014-03-14 – 2014-03-15 (×2): 1000 mg via ORAL
  Filled 2014-03-14 (×3): qty 2

## 2014-03-14 MED ORDER — PHENYLEPHRINE HCL 10 MG/ML IJ SOLN
INTRAMUSCULAR | Status: DC | PRN
  Administered 2014-03-14 (×3): 40 ug via INTRAVENOUS

## 2014-03-14 MED ORDER — AMLODIPINE BESYLATE 5 MG PO TABS
5.0000 mg | ORAL_TABLET | Freq: Every day | ORAL | Status: DC
  Administered 2014-03-15: 5 mg via ORAL
  Filled 2014-03-14: qty 1

## 2014-03-14 MED ORDER — PROPOFOL 10 MG/ML IV BOLUS
INTRAVENOUS | Status: AC
  Filled 2014-03-14: qty 20

## 2014-03-14 SURGICAL SUPPLY — 54 items
BAG URINE DRAINAGE (UROLOGICAL SUPPLIES) ×4 IMPLANT
BAG URO CATCHER STRL LF (DRAPE) ×4 IMPLANT
BENZOIN TINCTURE PRP APPL 2/3 (GAUZE/BANDAGES/DRESSINGS) IMPLANT
BLADE HEX COATED 2.75 (ELECTRODE) ×4 IMPLANT
BLADE SURG 15 STRL LF DISP TIS (BLADE) IMPLANT
BLADE SURG 15 STRL SS (BLADE)
BNDG GAUZE ELAST 4 BULKY (GAUZE/BANDAGES/DRESSINGS) IMPLANT
CATH FOLEY 3WAY 30CC 22FR (CATHETERS) IMPLANT
CATH HEMA 3WAY 30CC 22FR COUDE (CATHETERS) ×4 IMPLANT
COVER SURGICAL LIGHT HANDLE (MISCELLANEOUS) ×8 IMPLANT
DERMABOND ADVANCED (GAUZE/BANDAGES/DRESSINGS)
DERMABOND ADVANCED .7 DNX12 (GAUZE/BANDAGES/DRESSINGS) IMPLANT
DRAIN PENROSE 18X1/2 LTX STRL (DRAIN) ×4 IMPLANT
DRAIN PENROSE 18X1/4 LTX STRL (WOUND CARE) ×4 IMPLANT
DRAPE CAMERA CLOSED 9X96 (DRAPES) ×4 IMPLANT
ELECT BUTTON HF 24-28F 2 30DE (ELECTRODE) IMPLANT
ELECT LOOP MED HF 24F 12D (CUTTING LOOP) IMPLANT
ELECT LOOP MED HF 24F 12D CBL (CLIP) IMPLANT
ELECT REM PT RETURN 9FT ADLT (ELECTROSURGICAL) ×4
ELECT RESECT VAPORIZE 12D CBL (ELECTRODE) IMPLANT
ELECTRODE REM PT RTRN 9FT ADLT (ELECTROSURGICAL) ×2 IMPLANT
GAUZE SPONGE 4X4 12PLY STRL (GAUZE/BANDAGES/DRESSINGS) IMPLANT
GLOVE BIOGEL M STRL SZ7.5 (GLOVE) ×8 IMPLANT
GOWN STRL REUS W/TWL XL LVL3 (GOWN DISPOSABLE) IMPLANT
GUIDEWIRE ANG ZIPWIRE 038X150 (WIRE) ×4 IMPLANT
HOLDER FOLEY CATH W/STRAP (MISCELLANEOUS) ×4 IMPLANT
IV NS IRRIG 3000ML ARTHROMATIC (IV SOLUTION) IMPLANT
KIT ASPIRATION TUBING (SET/KITS/TRAYS/PACK) IMPLANT
KIT BASIN OR (CUSTOM PROCEDURE TRAY) ×4 IMPLANT
MANIFOLD NEPTUNE II (INSTRUMENTS) ×4 IMPLANT
NEEDLE HYPO 22GX1.5 SAFETY (NEEDLE) IMPLANT
NS IRRIG 1000ML POUR BTL (IV SOLUTION) IMPLANT
PACK BASIC VI WITH GOWN DISP (CUSTOM PROCEDURE TRAY) IMPLANT
PACK CYSTO (CUSTOM PROCEDURE TRAY) ×4 IMPLANT
PACK GENERAL/GYN (CUSTOM PROCEDURE TRAY) ×4 IMPLANT
PENCIL BUTTON HOLSTER BLD 10FT (ELECTRODE) IMPLANT
PLUG CATH AND CAP STER (CATHETERS) IMPLANT
SHEET LAVH (DRAPES) ×4 IMPLANT
SPONGE GAUZE 4X4 12PLY (GAUZE/BANDAGES/DRESSINGS) ×4 IMPLANT
SPONGE LAP 4X18 X RAY DECT (DISPOSABLE) IMPLANT
SUPPORT SCROTAL LG STRP (MISCELLANEOUS) ×3 IMPLANT
SUPPORTER ATHLETIC LG (MISCELLANEOUS) ×1
SUT ETHILON 3 0 PS 1 (SUTURE) ×4 IMPLANT
SUT SILK 0 (SUTURE) ×2
SUT SILK 0 30XBRD TIE 6 (SUTURE) ×2 IMPLANT
SUT VIC AB 3-0 SH 27 (SUTURE) ×2
SUT VIC AB 3-0 SH 27XBRD (SUTURE) ×2 IMPLANT
SYR 30ML LL (SYRINGE) IMPLANT
SYR CONTROL 10ML LL (SYRINGE) IMPLANT
SYRINGE 20CC LL (MISCELLANEOUS) IMPLANT
SYRINGE IRR TOOMEY STRL 70CC (SYRINGE) ×4 IMPLANT
TUBING CONNECTING 10 (TUBING) ×3 IMPLANT
TUBING CONNECTING 10' (TUBING) ×1
WATER STERILE IRR 1500ML POUR (IV SOLUTION) IMPLANT

## 2014-03-14 NOTE — Brief Op Note (Signed)
03/14/2014  6:36 PM  PATIENT:  Brett Anthony  80 y.o. male  PRE-OPERATIVE DIAGNOSIS:  METASTATIC PROSTATE CANCER, URINARY RETENTION  POST-OPERATIVE DIAGNOSIS:  METASTATIC PROSTATE CANCER, URINARY RETENTION  PROCEDURE:  Procedure(s): TRANSURETHRAL RESECTION OF THE PROSTATE WITH GYRUS INSTRUMENTS (N/A) ORCHIECTOMY (Bilateral)  SURGEON:  Surgeon(s) and Role:    * Alexis Frock, MD - Primary  PHYSICIAN ASSISTANT:   ASSISTANTS: none   ANESTHESIA:   local and general  EBL:  Total I/O In: 1000 [I.V.:1000] Out: 75 [Blood:75]  BLOOD ADMINISTERED:none  DRAINS: 62f foley to NS irrigation, efflux light pink   LOCAL MEDICATIONS USED:  MARCAINE     SPECIMEN:  Source of Specimen:  1 - prostate chips, 2 - right testicle, 3- left testicle  DISPOSITION OF SPECIMEN:  PATHOLOGY  COUNTS:  YES  TOURNIQUET:  * No tourniquets in log *  DICTATION: .Other Dictation: Dictation Number [id number]  PLAN OF CARE: Admit for overnight observation  PATIENT DISPOSITION:  PACU - hemodynamically stable.   Delay start of Pharmacological VTE agent (>24hrs) due to surgical blood loss or risk of bleeding: yes

## 2014-03-14 NOTE — Transfer of Care (Signed)
Immediate Anesthesia Transfer of Care Note  Patient: Brett Anthony  Procedure(s) Performed: Procedure(s): TRANSURETHRAL RESECTION OF THE PROSTATE WITH GYRUS INSTRUMENTS (N/A) ORCHIECTOMY (Bilateral)  Patient Location: PACU  Anesthesia Type:General  Level of Consciousness: awake, confused, lethargic and responds to stimulation  Airway & Oxygen Therapy: Patient Spontanous Breathing and Patient connected to face mask oxygen  Post-op Assessment: Report given to PACU RN, Post -op Vital signs reviewed and stable and Patient moving all extremities  Post vital signs: Reviewed and stable  Complications: No apparent anesthesia complications

## 2014-03-14 NOTE — H&P (Signed)
Brett Anthony is an 80 y.o. male.    Chief Complaint: Pre-OP Transurethral Resection of Prostate and Orchiectomy for Advanced Prostate Cancer  HPI:   1 - Urinary Retention / Large Prostate - New urinary retention 11/2013 on eval acute renal failure that resolved with cathter placement. DRE 12/2013 60gm with multiple hard nodules. No h/o straddle injury. No h/o spine injury or LE deficits.   2 - Renal Insufficiency - Pt with acute rise in Cr to 13 with hyperkalemia 2015 during episode of retention. Most recent Cr 1.3 12/2013 prior to DC from hospital.   3 - Metastatic High-Risk Prostate Cancer - Gleason 5+4=9 in multiple cores on eval nodular prostate and PSA 40 01/2014. CT abd/pelvis w/o pelvic adenopathy or axial / hip blastic lesions. Bone scan however with rt acetabular activity c/w metastasis. He does admits to some progressive rt hip pain.   4 - Lung Nodules - Several left lung nodules up to 20mm incidental on abd CT 2015, dedicated chest CT pending.   PMH sig for HTN, open chole. No known CV disease. He is remarkably spry, continues to drive, and lives independently with his wife though his son-in-law "Brett Anthony" helps them out considerably and is very involved in his care.    Today Brett Anthony is seen to proceed with TURP and bilateral orchiectomy for management of his urinary retention and metastatic prostate cancer. No interval fevers.    Past Medical History  Diagnosis Date  . Arthritis   . Prostate cancer     "cancer spread to right hip"  . Cataract     left eye    Past Surgical History  Procedure Laterality Date  . Cholecystectomy  1970's  . Cataract extraction Right     No family history on file. Social History:  reports that he has quit smoking. His smoking use included Pipe, Cigars, and Cigarettes. He has a 45 pack-year smoking history. He has never used smokeless tobacco. He reports that he does not drink alcohol or use illicit drugs.  Allergies: No Known Allergies  No  prescriptions prior to admission    No results found for this or any previous visit (from the past 48 hour(s)). No results found.  Review of Systems  Constitutional: Negative.  Negative for fever and chills.  HENT: Negative.   Eyes: Negative.   Respiratory: Negative.   Cardiovascular: Negative.   Gastrointestinal: Negative.   Genitourinary: Negative.        Remains in urinary retention  Skin: Negative.   Neurological: Negative.   Endo/Heme/Allergies: Negative.   Psychiatric/Behavioral: Negative.     There were no vitals taken for this visit. Physical Exam  Constitutional: He is oriented to person, place, and time. He appears well-developed.  eldelry  HENT:  Head: Normocephalic and atraumatic.  Eyes: Pupils are equal, round, and reactive to light.  Neck: Normal range of motion. Neck supple.  Cardiovascular: Normal rate and regular rhythm.   Respiratory: Effort normal.  GI: Soft. Bowel sounds are normal.  Genitourinary: Penis normal.  Foley c/d/i with yellow urine.  Musculoskeletal: Normal range of motion.  Neurological: He is alert and oriented to person, place, and time.  Skin: Skin is warm and dry.  Psychiatric: He has a normal mood and affect. His behavior is normal. Judgment and thought content normal.     Assessment/Plan  1 - Urinary Retention - Likely due to large prostate (159mL by CT), no spine lesions by imaging. I feel highest chance of getting catheter free would be  TURP at time of orchiectomy.  We rediscussed options for medical refractory prostatic outlet obstruction including TURP, TUNA, TUMT, Green-Light, Ho-LEP, and simple prostatectomy with their respective risks, benefits, and long-term outcomes data. Pt has opted for TURP. We rediscussed the typical peri-operative course with overnight admission and discharge with foley in place with subsequent office voiding trial few days later. We rediscussed risks including bleeding, infection, incontinence, need for  repeat procedures / tissue regrowth over time as well as rare risks including DVT, PE, MI, CVA and mortality.   2 - Renal Insufficiency - likely due to retention as per above, as baseline <1.5, no further work-up / imaging at this time.  3 -Metastatic High-Risk Prostate Cancer -   We rediscussed bilateral scrotal approach orchiectomy as a very efficient and permanent means of androgen deprivation with men reaching castrate levels of testosterone without flair within a matter of hours. Advantages such as decreased need for MD follow-up, decreased overall cost, decreased need for lab testing / hormone shots as well as disadvantages including poor cosmesis, lack of masculinity, inability to undergo intermittent androgen deprivation discussed. We rediscussed risks including disease progression, hematoma / bleeding, infection as well as rare risks such as DVT, PE, MI, CVA, Mortality. Marland Kitchen  4 - Lung Nodules - will defer additional eval until establishes with new PCP. Asymptomatic.  Brett Anthony 03/14/2014, 6:32 AM

## 2014-03-14 NOTE — Anesthesia Preprocedure Evaluation (Addendum)
Anesthesia Evaluation  Patient identified by MRN, date of birth, ID band Patient awake    Reviewed: Allergy & Precautions, H&P , NPO status , Patient's Chart, lab work & pertinent test results  Airway Mallampati: II TM Distance: >3 FB Neck ROM: Full    Dental  (+) Dental Advisory Given   Pulmonary former smoker,  breath sounds clear to auscultation        Cardiovascular hypertension, Pt. on medications + dysrhythmias Rhythm:Regular Rate:Normal     Neuro/Psych negative neurological ROS  negative psych ROS   GI/Hepatic negative GI ROS, Neg liver ROS,   Endo/Other  negative endocrine ROS  Renal/GU ARFRenal disease     Musculoskeletal negative musculoskeletal ROS (+)   Abdominal (+) - obese,   Peds  Hematology negative hematology ROS (+)   Anesthesia Other Findings   Reproductive/Obstetrics                        Anesthesia Physical Anesthesia Plan  ASA: III  Anesthesia Plan: General   Post-op Pain Management:    Induction: Intravenous  Airway Management Planned: LMA  Additional Equipment:   Intra-op Plan:   Post-operative Plan: Extubation in OR  Informed Consent: I have reviewed the patients History and Physical, chart, labs and discussed the procedure including the risks, benefits and alternatives for the proposed anesthesia with the patient or authorized representative who has indicated his/her understanding and acceptance.   Dental advisory given  Plan Discussed with: CRNA  Anesthesia Plan Comments:         Anesthesia Quick Evaluation

## 2014-03-14 NOTE — Anesthesia Postprocedure Evaluation (Signed)
Anesthesia Post Note  Patient: Brett Anthony  Procedure(s) Performed: Procedure(s) (LRB): TRANSURETHRAL RESECTION OF THE PROSTATE WITH GYRUS INSTRUMENTS (N/A) ORCHIECTOMY (Bilateral)  Anesthesia type: General  Patient location: PACU  Post pain: Pain level controlled  Post assessment: Post-op Vital signs reviewed  Last Vitals:  Filed Vitals:   03/14/14 1906  Temp: 36.9 C  Resp: 14    Post vital signs: Reviewed  Level of consciousness: sedated  Complications: No apparent anesthesia complications

## 2014-03-15 DIAGNOSIS — C61 Malignant neoplasm of prostate: Secondary | ICD-10-CM | POA: Diagnosis not present

## 2014-03-15 MED ORDER — SULFAMETHOXAZOLE-TMP DS 800-160 MG PO TABS: 1.0000 | ORAL_TABLET | Freq: Two times a day (BID) | ORAL | Status: DC

## 2014-03-15 MED ORDER — TRAMADOL HCL 50 MG PO TABS: 50.0000 mg | ORAL_TABLET | Freq: Four times a day (QID) | ORAL | Status: DC | PRN

## 2014-03-15 MED ORDER — SENNOSIDES-DOCUSATE SODIUM 8.6-50 MG PO TABS: 1.0000 | ORAL_TABLET | Freq: Two times a day (BID) | ORAL | Status: DC

## 2014-03-15 NOTE — Op Note (Signed)
[name], [name] NO.:  [id number]  MEDICAL RECORD NO.:  [id number]  LOCATION:  3295                         FACILITY:  [facility]  PHYSICIAN:  Alexis Frock, MD     DATE OF BIRTH:  [birth year]  DATE OF PROCEDURE:  03/14/2014 DATE OF DISCHARGE:                              OPERATIVE REPORT   DIAGNOSIS:  Urinary retention and metastatic prostate cancer.  PROCEDURE: 1. Transurethral resection of the prostate. 2. Bilateral orchiectomy.  ESTIMATED BLOOD LOSS:  50 mL.  COMPLICATIONS:  None.  SPECIMEN: 1. Prostate chips for pathology. 2. Right and left testicles for permanent pathology.  DRAINS: 1. A 22-French 3-way Foley catheter with 40 mL in the balloon. 2. Normal saline irrigation 1 drop per second, efflux light pink.  FINDINGS: 1. Large bilobar prostatic hypertrophy. 2. Excellent channel from the verumontanum to the area of the bladder,     neck post resection. 3. Non-damage to the bladder or ureteral orifices, post resection. 4. Unremarkable scrotal contents, no hernias.  INDICATION:  Brett Anthony is a very vigorous 80 year old gentleman with recent history of urinary retention and elevated PSA up to 40.  He was found on evaluation of this to have a large prostate of 115 g as well as metastatic prostate cancer,  multifocal, Gleason 9 by needle biopsy in several areas and by bone scan, concerning for metastatic disease in the axial skeleton.  Options were discussed for management including observation with a periodic catheter change versus medical therapy versus surgical therapy, and he wished to proceed with bilateral orchiectomy for androgen deprivation and transurethral of the prostate with goal of becoming catheter free.  Informed consent was obtained and placed in medical record.  PROCEDURE IN DETAIL:  The patient being Brett Anthony verified, procedure being transurethral resection of prostate and bilateral orchiectomy were confirmed.   Procedures carried out.  Time-out was performed.  Intravenous antibiotics administered.  General LMA anesthesia was introduced.  Patient was placed into a low lithotomy position.  Sterile field was created by prepping the patient's penis, perineum, and proximal thighs using iodine x3.  Next, cystourethroscopy was performed using a 26-French resectoscope sheath with visual obturator.  Inspection of the anterior and posterior urethra revealed large bilobar prostatic hypertrophy.  No significant median lobe.  There was mild trabeculation of urinary bladder.  Ureteral orifices were in the normal anatomic position.  The gyrus medium resectoscope loop was used to perform transurethral resection of the prostate first at the 12 o'clock position, followed by the right lobe which is the dominant lobe in the left lobe from the area of the bladder neck to the area of the verumontanum taking resection down to what appeared to be the circular fibers of the prostate capsule and taking great care to avoid prostate capsule perforation or undermining of bladder neck neither which occurred.  Following these maneuvers, all prostate chips were irrigated and set aside for permanent pathology.  Additional coagulation current was applied to the prostatic fossa resulted in excellent hemostasis.  Inspection after resection revealed complete resolution of all prostate chips.  Excellent wide open channel from the area of verumontanum to the bladder neck.  No evidence of prostate capsule  perforation.  Ureteral orifices were grossly uninjured.  A new 22-French Coude-type catheter was placed over a ZIPwire to the level of urinary bladder, 40 mL sterile water in the balloon.  This connected to normal saline irrigation of 1 drop per second.  The efflux was very light pink.  This was set aside.  A new sterile field was created by completely re-prepping the patient's scrotum, placing new drape over the scrotum and legs  and all surgeon and scrub personnel donned new gloves and gowns.  Attention was then directed at orchidectomy.  Incision was made along the median raphe for distance of approximately 3 cm.  Dissection then proceeded directly on to the left testicle which was then delivered into the operative field and his tunics were carefully swept away.  The cord was separated into 2 components, one containing the vas deferens and the other containing the vessels.  These were separately clamped and ligated x3.  The left testicle being set aside for permanent pathology. Following these maneuvers, hemostasis appeared excellent.  There was no obvious hernias within the left hemiscrotum.  Similarly, the right testicle was delivered into the operative field and tunics was carefully swept away delivering the cord which is separated into 2 components, one containing the vas and the other containing the vessels.  These were separately clamped and ligated x3 using 0 silk.  The right testicle was set aside for permanent pathology.  Again, no hernias were seen in the right scrotal compartment either.  The cord stump were delivered into the scrotum.  A small counter incision was made and deep tendon portion towards Penrose drain was brought into the scrotum and anchored in place using a single Ethilon suture.  The skin was reapproximated using running 3-0 Vicryl taking great care not to include the temporary drain.  A 10 mL of dilute Marcaine was infiltrated along the incision line, dressing of scrotal support and fluffs were applied.  Procedure was terminated.  The patient tolerated the procedure well.  There were no immediate periprocedural complications.  The patient was taken to postanesthesia care in stable condition.          ______________________________ Alexis Frock, MD     TM/MEDQ  D:  03/14/2014  T:  03/15/2014  Job:  [id number]

## 2014-03-15 NOTE — Discharge Summary (Signed)
Physician Discharge Summary  Patient ID: Brett Anthony MRN: [id number] DOB/AGE: 1919-07-17 80 y.o.  Admit date: 03/14/2014 Discharge date: 03/15/2014  Admission Diagnoses: Metastatic Prostate Cancer, Urinary Retention.   Discharge Diagnoses:  Active Problems:   Malignant neoplasm of prostate metastatic to bone   Discharged Condition: good  Hospital Course:   1 - Metastatic Prostate Cancer, Urinary Retention - underwent transurethral resection of prostate and bilateral scrotal orchiectomy 03/14/14 for refractory urinary retention and metastatic prostate cancer. Observed ove rnight on decreasing bladder irrigation post-op. By POD 1, the day of discharge, pt ambulatory, pain controlled with PO meds, afebrile, not requiring and bladder irrigation, and felt to be adequate for discharge.   Consults: None  Significant Diagnostic Studies: labs: surgical pathology - pending  Treatments: surgery:  transurethral resection of prostate and bilateral scrotal orchiectomy 03/14/14   Discharge Exam: Blood pressure 138/52, pulse 75, temperature 97.5 F (36.4 C), temperature source Oral, resp. rate 18, height 5\' 5"  (1.651 m), weight 53.162 kg (117 lb 3.2 oz), SpO2 100.00%. General appearance: alert, cooperative, appears stated age and very vigorous for age Head: Normocephalic, without obvious abnormality, atraumatic Throat: lips, mucosa, and tongue normal; teeth and gums normal Neck: supple, symmetrical, trachea midline Back: symmetric, no curvature. ROM normal. No CVA tenderness. Resp: non-labored Chest wall: no tenderness Cardio: Nl rate GI: soft, non-tender; bowel sounds normal; no masses,  no organomegaly Male genitalia: normal, foley c/d/i with light pink urine, no clots, OFF irrigation. penrose draini ndependant scrotum wtih scant serosanguinous wound spotting as expected. No hematomas. Extremities: extremities normal, atraumatic, no cyanosis or edema Pulses: 2+ and symmetric Skin: Skin  color, texture, turgor normal. No rashes or lesions Lymph nodes: Cervical, supraclavicular, and axillary nodes normal. Neurologic: Grossly normal  Disposition: 01-Home or Self Care     Medication List    STOP taking these medications       tamsulosin 0.4 MG Caps capsule  Commonly known as:  FLOMAX      TAKE these medications       albuterol 108 (90 BASE) MCG/ACT inhaler  Commonly known as:  PROVENTIL HFA;VENTOLIN HFA  Inhale 1 puff into the lungs every 6 (six) hours as needed for wheezing or shortness of breath.     amLODipine 5 MG tablet  Commonly known as:  NORVASC  Take 5 mg by mouth every morning.     amLODipine 5 MG tablet  Commonly known as:  NORVASC  Take 1 tablet (5 mg total) by mouth daily.     ibuprofen 200 MG tablet  Commonly known as:  ADVIL,MOTRIN  Take 400 mg by mouth every 6 (six) hours as needed for mild pain or moderate pain.     senna-docusate 8.6-50 MG per tablet  Commonly known as:  Senokot-S  Take 1 tablet by mouth 2 (two) times daily. While taking pain meds to prevent constipation     sulfamethoxazole-trimethoprim 800-160 MG per tablet  Commonly known as:  BACTRIM DS  Take 1 tablet by mouth 2 (two) times daily. X 5 days to prevent post-op infection     traMADol 50 MG tablet  Commonly known as:  ULTRAM  Take 1 tablet (50 mg total) by mouth every 6 (six) hours as needed for moderate pain. Post-operatively           Follow-up Information   Follow up with Alexis Frock, MD On 03/19/2014. (at 10 AM for MD visit, catheter removal and scrotal drain removal)    Specialty:  Urology   Contact  information:   [place] [address] [phone]       Signed: Alexis Frock 03/15/2014, 7:46 AM

## 2014-03-15 NOTE — Progress Notes (Signed)
Utilization review completed.  

## 2014-03-15 NOTE — Discharge Instructions (Signed)
1 - You may have urinary urgency (bladder spasms) and bloody urine on / off x 1-2 weeks. This is normal. ° °2 - Call MD or go to ER for fever >102, severe pain / nausea / vomiting not relieved by medications, or acute change in medical status ° °

## 2014-03-16 ENCOUNTER — Encounter (HOSPITAL_COMMUNITY): Payer: Self-pay | Admitting: Urology

## 2014-03-20 ENCOUNTER — Encounter (HOSPITAL_COMMUNITY): Payer: Self-pay | Admitting: Emergency Medicine

## 2014-03-20 ENCOUNTER — Emergency Department (HOSPITAL_COMMUNITY)
Admission: EM | Admit: 2014-03-20 | Discharge: 2014-03-20 | Disposition: A | Discharge status: Home / Self Care | Payer: Medicare Other | Attending: Emergency Medicine | Admitting: Emergency Medicine

## 2014-03-20 DIAGNOSIS — Z8739 Personal history of other diseases of the musculoskeletal system and connective tissue: Secondary | ICD-10-CM | POA: Diagnosis not present

## 2014-03-20 DIAGNOSIS — Z8669 Personal history of other diseases of the nervous system and sense organs: Secondary | ICD-10-CM | POA: Insufficient documentation

## 2014-03-20 DIAGNOSIS — R339 Retention of urine, unspecified: Secondary | ICD-10-CM | POA: Diagnosis not present

## 2014-03-20 DIAGNOSIS — Z79899 Other long term (current) drug therapy: Secondary | ICD-10-CM | POA: Insufficient documentation

## 2014-03-20 DIAGNOSIS — R109 Unspecified abdominal pain: Secondary | ICD-10-CM | POA: Diagnosis present

## 2014-03-20 DIAGNOSIS — R143 Flatulence: Secondary | ICD-10-CM

## 2014-03-20 DIAGNOSIS — Z8546 Personal history of malignant neoplasm of prostate: Secondary | ICD-10-CM | POA: Diagnosis not present

## 2014-03-20 DIAGNOSIS — Z87891 Personal history of nicotine dependence: Secondary | ICD-10-CM | POA: Diagnosis not present

## 2014-03-20 DIAGNOSIS — I10 Essential (primary) hypertension: Secondary | ICD-10-CM | POA: Diagnosis not present

## 2014-03-20 DIAGNOSIS — R142 Eructation: Secondary | ICD-10-CM

## 2014-03-20 DIAGNOSIS — Z9089 Acquired absence of other organs: Secondary | ICD-10-CM | POA: Insufficient documentation

## 2014-03-20 DIAGNOSIS — Z9079 Acquired absence of other genital organ(s): Secondary | ICD-10-CM

## 2014-03-20 DIAGNOSIS — R141 Gas pain: Secondary | ICD-10-CM | POA: Insufficient documentation

## 2014-03-20 NOTE — ED Provider Notes (Signed)
CSN: [id number]     Arrival date & time 03/20/14  7673 History   First MD Initiated Contact with Patient 03/20/14 0630     Chief Complaint  Patient presents with  . Abdominal Pain     (Consider location/radiation/quality/duration/timing/severity/associated sxs/prior Treatment) HPI Comments: Patient with a history of Metastatic Prostate Cancer s/p TURP and Scrotal Orchiectomy on 03/14/14 presents today with lower abdominal pain and pressure.  Patient reports that the pain has been constant since last evening and is gradually worsening.  Pain does not radiate.  He was seen by Urology in the office yesterday morning and had his Catheter removed.  He reports very little urination since that time.  He denies any pain with urination.  He has noticed a pink tinge to his urine.  He denies fever, chills, nausea, or vomiting.  Last BM was yesterday.  He reports that he is currently taking pain medication, but does not feel that the medication is helping with the abdominal pressure.    The history is provided by the patient.    Past Medical History  Diagnosis Date  . Arthritis   . Prostate cancer     "cancer spread to right hip"  . Cataract     left eye  . Hypertension    Past Surgical History  Procedure Laterality Date  . Cholecystectomy  1970's  . Cataract extraction Right   . Transurethral resection of prostate N/A 03/14/2014    Procedure: TRANSURETHRAL RESECTION OF THE PROSTATE WITH GYRUS INSTRUMENTS;  Surgeon: Alexis Frock, MD;  Location: [facility];  Service: Urology;  Laterality: N/A;  . Orchiectomy Bilateral 03/14/2014    Procedure: ORCHIECTOMY;  Surgeon: Alexis Frock, MD;  Location: [facility];  Service: Urology;  Laterality: Bilateral;   History reviewed. No pertinent family history. History  Substance Use Topics  . Smoking status: Former Smoker -- 1.50 packs/day for 30 years    Types: Pipe, Cigars, Cigarettes  . Smokeless tobacco: Never Used     Comment: quit 1970's  . Alcohol Use:  No    Review of Systems  All other systems reviewed and are negative.     Allergies  Review of patient's allergies indicates no known allergies.  Home Medications   Prior to Admission medications   Medication Sig Start Date End Date Taking? Authorizing Provider  albuterol (PROVENTIL HFA;VENTOLIN HFA) 108 (90 BASE) MCG/ACT inhaler Inhale 1 puff into the lungs every 6 (six) hours as needed for wheezing or shortness of breath.    Historical Provider, MD  amLODipine (NORVASC) 5 MG tablet Take 5 mg by mouth every morning.    Historical Provider, MD  amLODipine (NORVASC) 5 MG tablet Take 1 tablet (5 mg total) by mouth daily. 03/01/14   Lahoma Rocker, PA  ibuprofen (ADVIL,MOTRIN) 200 MG tablet Take 400 mg by mouth every 6 (six) hours as needed for mild pain or moderate pain.    Historical Provider, MD  senna-docusate (SENOKOT-S) 8.6-50 MG per tablet Take 1 tablet by mouth 2 (two) times daily. While taking pain meds to prevent constipation 03/15/14   Alexis Frock, MD  sulfamethoxazole-trimethoprim (BACTRIM DS) 800-160 MG per tablet Take 1 tablet by mouth 2 (two) times daily. X 5 days to prevent post-op infection 03/15/14   Alexis Frock, MD  traMADol (ULTRAM) 50 MG tablet Take 1 tablet (50 mg total) by mouth every 6 (six) hours as needed for moderate pain. Post-operatively 03/15/14   Alexis Frock, MD   BP 169/74  Temp(Src) 97.5 F (36.4  C) (Oral)  Resp 26  Ht 5\' 5"  (1.651 m)  SpO2 97% Physical Exam  Nursing note and vitals reviewed. Constitutional: He appears well-developed and well-nourished. No distress.  HENT:  Head: Normocephalic and atraumatic.  Mouth/Throat: Oropharynx is clear and moist.  Neck: Normal range of motion. Neck supple.  Cardiovascular: Normal rate, regular rhythm and normal heart sounds.   Pulmonary/Chest: Effort normal and breath sounds normal.  Abdominal: Soft. Bowel sounds are normal. He exhibits distension. He exhibits no mass. There is no rebound and no  guarding.  Mild tenderness to palpation of the lower abdomen  Musculoskeletal: Normal range of motion.  Neurological: He is alert.  Skin: Skin is warm and dry.  Psychiatric: He has a normal mood and affect.    ED Course  Procedures (including critical care time) Labs Review Labs Reviewed - No data to display  Imaging Review No results found.   EKG Interpretation None     Patient had Foley Catheter placed by RN and got out 800 cc instantly.  Patient reports significant improvement in pain.   MDM   Final diagnoses:  None   Patient with a history of Metastatic Prostate Cancer s/p TURP and Scrotal Orchiectomy on 03/14/14 presents today with lower abdominal pressure.  He had his Catheter removed by Urology yesterday and has urinated very little since that time.  Foley Catheter placed by RN in the ED and immediately returned 800 cc of Urine.  Patient reports significant improvement in pain after the Catheter was placed.  Abdomen soft with no rebound or guarding.  Patient is afebrile.  Feel that the cause of the abdominal pain is most likely related to Urinary Retention.  Foley Catheter replaced in the ED and patient instructed to follow up with Urology.  Patient also evaluated by Dr. Venora Maples who is in agreement with the plan.  Patient stable for discharge.  Return precautions given.    Hyman Bible, PA-C 03/20/14 [phone]

## 2014-03-20 NOTE — ED Notes (Signed)
Patient is alert and oriented x3.  He is complaining of abdominal pain that started last night after a urinary cath  Was removed yesterday.  Patient was seen for prostrates surgery last Wednesday.  Currently he rates his pain  8 of 10.

## 2014-03-20 NOTE — ED Notes (Signed)
Foley cath clamped off after 800 mL removed drained from patient To prevent bladder spasms.

## 2014-03-20 NOTE — Discharge Instructions (Signed)
Acute Urinary Retention °Acute urinary retention is the temporary inability to urinate. °This is a common problem in older men. As men age their prostates become larger and block the flow of urine from the bladder. This is usually a problem that has come on gradually.  °HOME CARE INSTRUCTIONS °If you are sent home with a Foley catheter and a drainage system, you will need to discuss the best course of action with your health care provider. While the catheter is in, maintain a good intake of fluids. Keep the drainage bag emptied and lower than your catheter. This is so that contaminated urine will not flow back into your bladder, which could lead to a urinary tract infection. °There are two main types of drainage bags. One is a large bag that usually is used at night. It has a good capacity that will allow you to sleep through the night without having to empty it. The second type is called a leg bag. It has a smaller capacity, so it needs to be emptied more frequently. However, the main advantage is that it can be attached by a leg strap and can go underneath your clothing, allowing you the freedom to move about or leave your home. °Only take over-the-counter or prescription medicines for pain, discomfort, or fever as directed by your health care provider.  °SEEK MEDICAL CARE IF: °· You develop a low-grade fever. °· You experience spasms or leakage of urine with the spasms. °SEEK IMMEDIATE MEDICAL CARE IF:  °· You develop chills or fever. °· Your catheter stops draining urine. °· Your catheter falls out. °· You start to develop increased bleeding that does not respond to rest and increased fluid intake. °MAKE SURE YOU: °· Understand these instructions. °· Will watch your condition. °· Will get help right away if you are not doing well or get worse. °Document Released: 11/23/2000 Document Revised: 08/22/2013 Document Reviewed: 01/26/2013 °[employer]® Patient Information ©2015 [employer]. This information is not  intended to replace advice given to you by your health care provider. Make sure you discuss any questions you have with your health care provider. ° °

## 2014-03-21 NOTE — ED Provider Notes (Signed)
Medical screening examination/treatment/procedure(s) were performed by non-physician practitioner and as supervising physician I was immediately available for consultation/collaboration.   EKG Interpretation None        Hoy Morn, MD 03/21/14 0400

## 2014-03-28 ENCOUNTER — Telehealth: Payer: Self-pay | Admitting: General Practice

## 2014-03-28 NOTE — Telephone Encounter (Signed)
appt scheduled. Thank you!

## 2014-03-28 NOTE — Telephone Encounter (Signed)
Pt has new pt appointment on 8/13. Pt had prostate cancer. Pt had surgery 7/15.  Pt is just not doing well. Loosing weight, weak, not feeling good.  Son would like to know if there is anyway you would see pt sooner than 8/13?  Pt does not have a pcp.  pls advise. No new pt appts until after his original date.

## 2014-03-28 NOTE — Telephone Encounter (Signed)
Juliann Pulse, please call pt and schedule for tomorrow at 3:00 pm per Dr. Yong Channel.

## 2014-03-29 ENCOUNTER — Ambulatory Visit (INDEPENDENT_AMBULATORY_CARE_PROVIDER_SITE_OTHER): Payer: Medicare Other | Admitting: Family Medicine

## 2014-03-29 ENCOUNTER — Encounter: Payer: Self-pay | Admitting: Family Medicine

## 2014-03-29 VITALS — BP 110/58 | HR 88 | Temp 97.4°F | Wt 110.0 lb

## 2014-03-29 DIAGNOSIS — Z66 Do not resuscitate: Secondary | ICD-10-CM | POA: Insufficient documentation

## 2014-03-29 DIAGNOSIS — C7952 Secondary malignant neoplasm of bone marrow: Secondary | ICD-10-CM

## 2014-03-29 DIAGNOSIS — C61 Malignant neoplasm of prostate: Secondary | ICD-10-CM | POA: Insufficient documentation

## 2014-03-29 DIAGNOSIS — E43 Unspecified severe protein-calorie malnutrition: Secondary | ICD-10-CM

## 2014-03-29 DIAGNOSIS — C7951 Secondary malignant neoplasm of bone: Secondary | ICD-10-CM

## 2014-03-29 DIAGNOSIS — I10 Essential (primary) hypertension: Secondary | ICD-10-CM

## 2014-03-29 MED ORDER — AMLODIPINE BESYLATE 5 MG PO TABS: 5.0000 mg | ORAL_TABLET | Freq: Every day | ORAL | Status: DC

## 2014-03-29 MED ORDER — TAMSULOSIN HCL 0.4 MG PO CAPS: 0.4000 mg | ORAL_CAPSULE | Freq: Every day | ORAL | Status: DC

## 2014-03-29 NOTE — Assessment & Plan Note (Addendum)
Weight loss/fatigue may simply be due to multiple medical procedures, effects of metastatic prostate cancer. Patient/Family states they do not want to be aggressive in investigation other than urology efforts. Decision made to follow up in 3-4 weeks to recheck weight and blood pressure. Encouraged to add a 2nd ensure to regimen. Could consider nutrition referral. No sleep issues to suggest addition of remeron. Denies feelings of depression but discussed doing geriatric depression screen or PHQ9 at next visit. Consider TSH

## 2014-03-29 NOTE — Progress Notes (Signed)
Brett Reddish, MD Phone: [phone]  Subjective:  Patient presents today to establish care. Patient had not seen a doctor since 1982 until this recent year where he has had several issues related to his prostate. Chief complaint-noted.   Weight loss/protein calorie malnutrition/Fatigue Prostate Cancer  Patient presented to care in late April after not having seen a doctor since 1982. He complained of fatigue, malaise, weaness, and poor appetite. He was found to have a creatinine of 13 and potassium of 7.1 a that time due to obstructive prostate (had complained of dribbling and weak. Supposedly patient weighed 145 before he started with symptoms 6 weeks prior to visit to hospital. Patient had outpatient urology follow up and  ultimately had TURP and orchiectomy after prostate cancer discovered. During workup, patient found to have metastatic prostate cancer high risk with gleason 5+4=9 with bone scan showing metastasis to right acetabulum. On Ct also noted to have several lung nodules (unclear if related). At this point, urology is managing prostate cancer. Family and patient have elected to not pursue aggressive treatment (states do not desire oncology referral) due to patient age. He has had multiple office visits to urology. Most recently he had a foley catheter in from Wednesday to Monday after his surgery. When foley was taken out, he had urinary retention issues and had to go to ED where foley was replaced. He has had foley readjusted since that time due to leaking. He sees Dr. Tresa Moore next week to have his current foley catheter removed. Currently patient is on ciprofloxacin and flomax in relation to urinary issues. No urology records available at this time other than H+P for procedure.   Main complaint today is that since initial hospitalization, patient has experienced fatigue and poor appetite. Son-in-law who brings patient to appointment states father in law used to weigh 145 and now weighs  110. Review of records back to May show a 13 lb weight loss in that time. Patient describes food not tasting well as it used to. He has a low appetite. Over last 2-3 days energy and appetite have both been better. He has been drinking 1 ensure a day.   Of note, patient is DNR/DNI  ROS- fatigue, poor appetite noted. No dysphagia or difficulty swallowing. No melena or brbpr or coughing up or throwing up blood. Has penile discomfort due to catheter. Otherwise patient denies issues in full 12 point review of systems.   Hypertension BP Readings from Last 3 Encounters:  03/29/14 110/58  03/20/14 169/74  03/15/14 138/52  Home BP monitoring-no Compliant with medications-yes without side effects except very infrequent lightheadedness with standing ROS-Denies any CP, HA, SOB, blurry vision.   The following were reviewed and entered/updated in epic: Past Medical History  Diagnosis Date  . Arthritis   . Prostate cancer     "cancer spread to right hip"  . Cataract     left eye  . Hypertension    Patient Active Problem List   Diagnosis Date Noted  . DNR Galvin Proffer 03/29/2014    Priority: High  . Malignant neoplasm of prostate metastatic to bone 03/14/2014    Priority: High  . Protein-calorie malnutrition, severe 12/27/2013    Priority: Medium  . HTN (hypertension) 12/26/2013    Priority: Medium  . LBBB (left bundle branch block) 12/26/2013    Priority: Medium   Past Surgical History  Procedure Laterality Date  . Cholecystectomy  1970's  . Cataract extraction Right   . Transurethral resection of prostate N/A 03/14/2014  Procedure: TRANSURETHRAL RESECTION OF THE PROSTATE WITH GYRUS INSTRUMENTS;  Surgeon: Alexis Frock, MD;  Location: [facility];  Service: Urology;  Laterality: N/A;  . Orchiectomy Bilateral 03/14/2014    Procedure: ORCHIECTOMY;  Surgeon: Alexis Frock, MD;  Location: [facility];  Service: Urology;  Laterality: Bilateral;    Family history- noncontributory. No prostate cancer  known.   Medications- reviewed and updated Current Outpatient Prescriptions  Medication Sig Dispense Refill  . amLODipine (NORVASC) 5 MG tablet Take 1 tablet (5 mg total) by mouth daily.  30 tablet  5  . ciprofloxacin (CIPRO) 250 MG tablet Take 250 mg by mouth 2 (two) times daily.      Marland Kitchen ibuprofen (ADVIL,MOTRIN) 200 MG tablet Take 400 mg by mouth every 6 (six) hours as needed for mild pain or moderate pain.      . tamsulosin (FLOMAX) 0.4 MG CAPS capsule Take 1 capsule (0.4 mg total) by mouth daily after breakfast.  30 capsule  5  . traMADol (ULTRAM) 50 MG tablet Take 1 tablet (50 mg total) by mouth every 6 (six) hours as needed for moderate pain. Post-operatively  30 tablet  0  . albuterol (PROVENTIL HFA;VENTOLIN HFA) 108 (90 BASE) MCG/ACT inhaler Inhale 1 puff into the lungs every 6 (six) hours as needed for wheezing or shortness of breath.      . senna-docusate (SENOKOT-S) 8.6-50 MG per tablet Take 1 tablet by mouth 2 (two) times daily. While taking pain meds to prevent constipation  30 tablet  0   No current facility-administered medications for this visit.    Allergies-reviewed and updated No Known Allergies  History   Social History  . Marital Status: Married    Spouse Name: N/A    Number of Children: N/A  . Years of Education: N/A   Social History Main Topics  . Smoking status: Former Smoker -- 2.00 packs/day for 30 years    Types: Pipe, Cigars, Cigarettes  . Smokeless tobacco: Never Used     Comment: quit 1970's  . Alcohol Use: No  . Drug Use: No  . Sexual Activity: Not on file   Other Topics Concern  . Not on file   Social History Narrative   Lives with wife.    Son in law lives 10 minutes away. 3 grandkids help out as well (30s and 40s)   ADLS-dress, feed, bathe   IADLs- Still drives at times, mainly family drives. Handles finances still with help of son. Shops at store when has strength.    DNR/DNI          ROS--See HPI   Objective: BP 110/58  Pulse 88   Temp(Src) 97.4 F (36.3 C)  Wt 110 lb (49.896 kg) Gen: NAD, resting comfortably in char, hard of hearing but understand if speaks loud HEENT: Mildly dry mucus membranes. Oropharynx normal Neck: no thyromegaly CV: RRR no murmurs rubs or gallops Lungs: CTAB no crackles, wheeze, rhonchi Abdomen: soft/nontender/nondistended/normal bowel sounds. No rebound or guarding.  Ext: no edema Skin: warm, dry, no rash Neuro: grossly normal, moves all extremities, PERRLA, gait appropriate for age  Assessment/Plan:  Malignant neoplasm of prostate metastatic to bone Gleason 5+4=9 with likely metastasis to right hip. ? Lung nodules related to prostate cancer. Obstructive issues led to AKI with Cr 13 and initial hospitalization which led to diagnosis of prostate cancer.    ARF (acute renal failure) Last creatinine shows resolution with GFR >90.   HTN (hypertension) On low dose amlodipine. Patient had very high blood  pressure in hospital when in AKI. Has been elevated since that time as well. Continue low dose as minimal orthostatic issues. F/u 3 weeks. Consider dose reduction.   Protein-calorie malnutrition, severe Weight loss/fatigue may simply be due to multiple medical procedures, effects of metastatic prostate cancer. Patient/Family states they do not want to be aggressive in investigation other than urology efforts. Decision made to follow up in 3-4 weeks to recheck weight and blood pressure. Encouraged to add a 2nd ensure to regimen. Could consider nutrition referral. No sleep issues to suggest addition of remeron. Denies feelings of depression but discussed doing geriatric depression screen or PHQ9 at next visit. Consider TSH

## 2014-03-29 NOTE — Assessment & Plan Note (Signed)
Gleason 5+4=9 with likely metastasis to right hip. ? Lung nodules related to prostate cancer. Obstructive issues led to AKI with Cr 13 and initial hospitalization which led to diagnosis of prostate cancer.

## 2014-03-29 NOTE — Assessment & Plan Note (Signed)
On low dose amlodipine. Patient had very high blood pressure in hospital when in AKI. Has been elevated since that time as well. Continue low dose as minimal orthostatic issues. F/u 3 weeks. Consider dose reduction.

## 2014-03-29 NOTE — Patient Instructions (Signed)
Great to meet you all.  Let's follow up in 3 weeks.  Try to do 2 ensures a day at the end of meals.  Continue current medications.

## 2014-03-29 NOTE — Assessment & Plan Note (Signed)
Last creatinine shows resolution with GFR >90.

## 2014-04-12 ENCOUNTER — Ambulatory Visit: Payer: Medicare Other | Admitting: Family Medicine

## 2014-04-19 ENCOUNTER — Encounter: Payer: Self-pay | Admitting: Family Medicine

## 2014-04-19 ENCOUNTER — Ambulatory Visit (INDEPENDENT_AMBULATORY_CARE_PROVIDER_SITE_OTHER): Payer: Medicare Other | Admitting: Family Medicine

## 2014-04-19 VITALS — BP 120/60 | HR 80 | Temp 97.4°F | Wt 116.0 lb

## 2014-04-19 DIAGNOSIS — E43 Unspecified severe protein-calorie malnutrition: Secondary | ICD-10-CM

## 2014-04-19 DIAGNOSIS — I10 Essential (primary) hypertension: Secondary | ICD-10-CM

## 2014-04-19 MED ORDER — AMLODIPINE BESYLATE 2.5 MG PO TABS: 2.5000 mg | ORAL_TABLET | Freq: Every day | ORAL | Status: DC

## 2014-04-19 NOTE — Patient Instructions (Signed)
Decrease amlodipine to 2.5 mg due to him having some lightheadedness last week  Weight is much better. Glad meals on wheels is helping and ensure. See me back in December to check in or sooner if any concerns arise.   2 hernias noted in groin (right the largest at 7x4 cm but we discussed he would not want to pursue surgery)  See you back in December  To consider: Health Maintenance Due  Topic Date Due  . Tetanus/tdap  09/02/1939  . Zostavax  09/01/1980  . Pneumococcal Polysaccharide Vaccine Age 49 And Over  09/01/1985  . Influenza Vaccine  03/31/2014

## 2014-04-19 NOTE — Assessment & Plan Note (Signed)
Weight up 5 pounds with Meals on Wheels and twice daily Ensure. Deferred depression workup. Followup in 3-4 months.

## 2014-04-19 NOTE — Assessment & Plan Note (Signed)
Occasional orthostatic symptoms. We'll cut amlodipine in half. Possibly discontinue at next visit depending on blood pressure.

## 2014-04-19 NOTE — Progress Notes (Signed)
  Garret Reddish, MD Phone: [phone]  Subjective:   Brett Anthony is a 80 y.o. year old very pleasant male patient who presents with the following:  Protein calorie malnutrition Weight loss Patient has started 2 ensures per day. Also, Started meals on wheels (eating wife's meal if she isn't hungry). Strength is returning with this. And he has been gaining weight. ROS-no fever chills or night sweats. Groin bulge noted a day or 2 ago with mild pain which resolved. Right knee occasionally feels cold or hot with slight tingling. No weakness or urinary incontinence or fecal incontinence.  Hypertension Compliant with amlodipine. Occ lightheadedness with standing. ROS-no chest pain or shortness of breath.  Past Medical History- Patient Active Problem List   Diagnosis Date Noted  . DNR Galvin Proffer 03/29/2014    Priority: High  . Malignant neoplasm of prostate metastatic to bone 03/14/2014    Priority: High  . Protein-calorie malnutrition, severe 12/27/2013    Priority: Medium  . HTN (hypertension) 12/26/2013    Priority: Medium  . LBBB (left bundle branch block) 12/26/2013    Priority: Medium   Medications- reviewed and updated Current Outpatient Prescriptions  Medication Sig Dispense Refill  . amLODipine (NORVASC) 5 MG tablet Take 1 tablet (5 mg total) by mouth daily.  30 tablet  5  . tamsulosin (FLOMAX) 0.4 MG CAPS capsule Take 1 capsule (0.4 mg total) by mouth daily after breakfast.  30 capsule  5  . senna-docusate (SENOKOT-S) 8.6-50 MG per tablet Take 1 tablet by mouth 2 (two) times daily. While taking pain meds to prevent constipation  30 tablet  0  . traMADol (ULTRAM) 50 MG tablet Take 1 tablet (50 mg total) by mouth every 6 (six) hours as needed for moderate pain. Post-operatively  30 tablet  0   No current facility-administered medications for this visit.    Objective: BP 120/60  Pulse 80  Temp(Src) 97.4 F (36.3 C)  Wt 116 lb (52.617 kg) Gen: NAD, resting comfortably in  chair CV: RRR no murmurs rubs or gallops Lungs: CTAB no crackles, wheeze, rhonchi MSK-in right groin as 7 x 4 cm hernia noted easily reduced. 2 x 3 cm hernia noted on left side. Ext: no edema, no warmth or edema of right knee. Normal temperature to touch.  Assessment/Plan:  Protein-calorie malnutrition, severe Weight up 5 pounds with Meals on Wheels and twice daily Ensure. Deferred depression workup. Followup in 3-4 months.  HTN (hypertension) Occasional orthostatic symptoms. We'll cut amlodipine in half. Possibly discontinue at next visit depending on blood pressure.   Meds ordered this encounter  Medications  . amLODipine (NORVASC) 2.5 MG tablet    Sig: Take 1 tablet (2.5 mg total) by mouth daily.    Dispense:  30 tablet    Refill:  5

## 2014-05-01 DIAGNOSIS — E46 Unspecified protein-calorie malnutrition: Secondary | ICD-10-CM

## 2014-05-01 DIAGNOSIS — R339 Retention of urine, unspecified: Secondary | ICD-10-CM

## 2014-05-01 DIAGNOSIS — N139 Obstructive and reflux uropathy, unspecified: Secondary | ICD-10-CM

## 2014-05-01 DIAGNOSIS — Z466 Encounter for fitting and adjustment of urinary device: Secondary | ICD-10-CM

## 2014-08-16 ENCOUNTER — Ambulatory Visit (INDEPENDENT_AMBULATORY_CARE_PROVIDER_SITE_OTHER): Payer: Medicare Other | Admitting: Family Medicine

## 2014-08-16 ENCOUNTER — Encounter: Payer: Self-pay | Admitting: Family Medicine

## 2014-08-16 DIAGNOSIS — I10 Essential (primary) hypertension: Secondary | ICD-10-CM

## 2014-08-16 DIAGNOSIS — M1990 Unspecified osteoarthritis, unspecified site: Secondary | ICD-10-CM | POA: Insufficient documentation

## 2014-08-16 DIAGNOSIS — M150 Primary generalized (osteo)arthritis: Secondary | ICD-10-CM

## 2014-08-16 DIAGNOSIS — M159 Polyosteoarthritis, unspecified: Secondary | ICD-10-CM

## 2014-08-16 MED ORDER — TAMSULOSIN HCL 0.4 MG PO CAPS: 0.4000 mg | ORAL_CAPSULE | Freq: Every day | ORAL | Status: DC

## 2014-08-16 NOTE — Patient Instructions (Addendum)
Stop amlodipine-blood pressure still looks great.   Only one pill to take now and I refilled this  You refused all immunizations-we will stop asking you about these.     6 months f/u

## 2014-08-16 NOTE — Assessment & Plan Note (Signed)
Knee pain likely OA. Patient does have history of metastatic prostate cancer and certainly could be bony metastasis but will monitor for now especial thely since resolved for a few days with ibuprofen.. Fatigue patient is experiencing in getting around could be age or prostate cancer related. Kidney disease in past with obstruction but ok for ibuprofen for now-repeat every 6 months likely.

## 2014-08-16 NOTE — Assessment & Plan Note (Signed)
Updated as reviewed last urology note-malignant, incurable but likely manageable. Plan for 3 month PSA follow up PSA from 07/2014. TURP in 2015.

## 2014-08-16 NOTE — Assessment & Plan Note (Signed)
Well controlled down to amlodipine 2.5mg . Decrease to off and follow up in 3-6 months.

## 2014-08-16 NOTE — Progress Notes (Signed)
  Garret Reddish, MD Phone: [phone]  Subjective:   Brett Anthony is a 80 y.o. year old very pleasant male patient who presents with the following:  Hypertension-well controlled down to 2.5mg  amlodipine  BP Readings from Last 3 Encounters:  08/16/14 110/64  04/19/14 120/60  03/29/14 110/58   Home BP monitoring-no Compliant with medications-yes, mild lightheadedness with standing improved from last visit with decrease in medicatoin ROS-Denies any CP, HA, SOB, blurry vision, LE edema  Bilateral knee pain R>L- mild worsening from last visit Knee pain, sometimes for several days and if takes ibuprofen improves and is more mobile.  ROS- denies hot/swollen joints  Past Medical History- Patient Active Problem List   Diagnosis Date Noted  . DNR Galvin Proffer 03/29/2014    Priority: High  . Malignant neoplasm of prostate metastatic to bone 03/14/2014    Priority: High  . Protein-calorie malnutrition, severe 12/27/2013    Priority: Medium  . HTN (hypertension) 12/26/2013    Priority: Medium  . LBBB (left bundle branch block) 12/26/2013    Priority: Medium  . Osteoarthritis 08/16/2014   Medications- reviewed and updated Current Outpatient Prescriptions  Medication Sig Dispense Refill  . amLODipine (NORVASC) 2.5 MG tablet Take 1 tablet (2.5 mg total) by mouth daily. 30 tablet 5  . senna-docusate (SENOKOT-S) 8.6-50 MG per tablet Take 1 tablet by mouth 2 (two) times daily. While taking pain meds to prevent constipation 30 tablet 0  . tamsulosin (FLOMAX) 0.4 MG CAPS capsule Take 1 capsule (0.4 mg total) by mouth daily after breakfast. 30 capsule 5  . traMADol (ULTRAM) 50 MG tablet Take 1 tablet (50 mg total) by mouth every 6 (six) hours as needed for moderate pain. Post-operatively 30 tablet 0   Objective: BP 110/64 mmHg  Pulse 62  Temp(Src) 97.3 F (36.3 C)  Wt 113 lb (51.256 kg) Gen: NAD, resting comfortably in chair, rises to feet without difficulty but uses chair arm for  assist CV: RRR no murmurs rubs or gallops Lungs: CTAB no crackles, wheeze, rhonchi Abdomen: soft/nontender/nondistended/normal bowel sounds.  Ext: no edema Skin: warm, dry, no rash Neuro: grossly normal, moves all extremities   Assessment/Plan:  Malignant neoplasm of prostate metastatic to bone Updated as reviewed last urology note-malignant, incurable but likely manageable. Plan for 3 month PSA follow up PSA from 07/2014. TURP in 2015.   HTN (hypertension) Well controlled down to amlodipine 2.5mg . Decrease to off and follow up in 3-6 months.   Osteoarthritis Knee pain likely OA. Patient does have history of metastatic prostate cancer and certainly could be bony metastasis but will monitor for now especial thely since resolved for a few days with ibuprofen.. Fatigue patient is experiencing in getting around could be age or prostate cancer related. Kidney disease in past with obstruction but ok for ibuprofen for now-repeat every 6 months likely.    Return precautions advised especially if weight does not stay stable around 112-115. 6 months f/u  Meds ordered this encounter  Medications  . tamsulosin (FLOMAX) 0.4 MG CAPS capsule    Sig: Take 1 capsule (0.4 mg total) by mouth daily after breakfast.    Dispense:  30 capsule    Refill:  5

## 2015-01-24 ENCOUNTER — Ambulatory Visit: Payer: Medicare Other | Admitting: Family Medicine

## 2015-02-07 ENCOUNTER — Ambulatory Visit (INDEPENDENT_AMBULATORY_CARE_PROVIDER_SITE_OTHER): Payer: Medicare Other | Admitting: Family Medicine

## 2015-02-07 ENCOUNTER — Encounter: Payer: Self-pay | Admitting: Family Medicine

## 2015-02-07 DIAGNOSIS — I10 Essential (primary) hypertension: Secondary | ICD-10-CM

## 2015-02-07 DIAGNOSIS — M150 Primary generalized (osteo)arthritis: Secondary | ICD-10-CM

## 2015-02-07 DIAGNOSIS — E43 Unspecified severe protein-calorie malnutrition: Secondary | ICD-10-CM

## 2015-02-07 DIAGNOSIS — M159 Polyosteoarthritis, unspecified: Secondary | ICD-10-CM

## 2015-02-07 NOTE — Assessment & Plan Note (Signed)
Controlled on amlodipine 2.5mg  previously and now off medication and remains controlled, we will continue off of medication.

## 2015-02-07 NOTE — Progress Notes (Signed)
Brett Reddish, MD  Subjective:  Brett Anthony is a 81 y.o. year old very pleasant male patient who presents with:  Hypertension-controlled without medication (stopped amlodipine 2.5mg  last visit)  BP Readings from Last 3 Encounters:  02/07/15 130/70  08/16/14 110/64  04/19/14 120/60  ROS-Denies any CP, HA, SOB, blurry vision, LE edema, transient weakness, orthopnea, PND.   R knee pain -pain for years. If takes ibuprofen, pain usually resolved but recurs. Creatinine around 1.3.  ROS- no warmth or swelling or redness around joint. No locking, popping or giving way of joint  Past Medical History- prostate cancer malignant to bone  Medications- reviewed and updated Current Outpatient Prescriptions  Medication Sig Dispense Refill  . tamsulosin (FLOMAX) 0.4 MG CAPS capsule Take 1 capsule (0.4 mg total) by mouth daily after breakfast. 30 capsule 5   No current facility-administered medications for this visit.    Objective: BP 130/70 mmHg  Pulse 66  Temp(Src) 97.5 F (36.4 C)  Wt 137 lb (62.143 kg) Gen: NAD, resting comfortably, elderly CV: RRR no murmurs rubs or gallops Lungs: CTAB no crackles, wheeze, rhonchi Abdomen: soft/nontender/nondistended/normal bowel sounds.  Ext: no edema Skin: warm, dry Neuro: grossly normal, moves all extremities  R knee Normal to inspection with no erythema or effusion or obvious bony abnormalities. Palpation normal with no warmth. Does have medial and lateral joint line tenderness ROM normal in flexion and extension and lower leg rotation. Ligaments with solid consistent endpoints including ACL, PCL, LCL, MCL.  Assessment/Plan:  HTN (hypertension) Controlled on amlodipine 2.5mg  previously and now off medication and remains controlled, we will continue off of medication.   Osteoarthritis Suspect R knee pain due to arthritis. Has not had imaging. i think therapeutic and diagnostic trial of injection is reasonable but patient declines today.  He uses prn ibuprofen and we will plan on bmet at follow up.   Protein-calorie malnutrition, severe Has been beating much better. Weight improved to 137 and no longer underweight. No signs or symptoms of CHF. Continue to monitor weight trend.   6 months or around time his wife returns for follow up with other provider in our clinic

## 2015-02-07 NOTE — Assessment & Plan Note (Signed)
Has been beating much better. Weight improved to 137 and no longer underweight. No signs or symptoms of CHF. Continue to monitor weight trend.

## 2015-02-07 NOTE — Patient Instructions (Signed)
See me back within 6 months- ok to time this with when your wife is coming in.   No changes today  Blood pressure looks great off the medicine.   We could do an injection in that right knee if it gets to bothering you too much.

## 2015-02-07 NOTE — Assessment & Plan Note (Signed)
Suspect R knee pain due to arthritis. Has not had imaging. i think therapeutic and diagnostic trial of injection is reasonable but patient declines today. He uses prn ibuprofen and we will plan on bmet at follow up.

## 2015-02-20 ENCOUNTER — Other Ambulatory Visit: Payer: Self-pay | Admitting: Family Medicine

## 2015-08-07 ENCOUNTER — Other Ambulatory Visit: Payer: Self-pay | Admitting: Family Medicine

## 2015-08-15 ENCOUNTER — Ambulatory Visit (INDEPENDENT_AMBULATORY_CARE_PROVIDER_SITE_OTHER): Payer: Medicare Other | Admitting: Family Medicine

## 2015-08-15 ENCOUNTER — Encounter: Payer: Self-pay | Admitting: Family Medicine

## 2015-08-15 VITALS — BP 118/68 | HR 69 | Temp 98.0°F | Wt 138.0 lb

## 2015-08-15 DIAGNOSIS — E43 Unspecified severe protein-calorie malnutrition: Secondary | ICD-10-CM | POA: Diagnosis not present

## 2015-08-15 DIAGNOSIS — M150 Primary generalized (osteo)arthritis: Secondary | ICD-10-CM

## 2015-08-15 DIAGNOSIS — M25561 Pain in right knee: Secondary | ICD-10-CM | POA: Diagnosis not present

## 2015-08-15 DIAGNOSIS — M159 Polyosteoarthritis, unspecified: Secondary | ICD-10-CM

## 2015-08-15 DIAGNOSIS — I10 Essential (primary) hypertension: Secondary | ICD-10-CM | POA: Diagnosis not present

## 2015-08-15 MED ORDER — TRAMADOL HCL 50 MG PO TABS
25.0000 mg | ORAL_TABLET | Freq: Two times a day (BID) | ORAL | Status: AC | PRN
Start: 2015-08-15 — End: ?

## 2015-08-15 NOTE — Assessment & Plan Note (Signed)
S: controlled. Remains off medication  BP Readings from Last 3 Encounters:  08/15/15 118/68  02/07/15 130/70  08/16/14 110/64  A/P:Continue without medication but continue to monitor trend as has required medicine in the past

## 2015-08-15 NOTE — Assessment & Plan Note (Signed)
S: Compliant with in ensure twice daily. Weight has been stable. A/P: Continue to monitor weight trend. Meals on Wheels and ensure is helping.

## 2015-08-15 NOTE — Progress Notes (Signed)
Brett Reddish, MD  Subjective:  Brett Anthony is a 81 y.o. year old very pleasant male patient who presents for/with See problem oriented charting ROS- no fever, chills, nausea, vomiting. No chest pain or shortness of breath.  Past Medical History-  Patient Active Problem List   Diagnosis Date Noted  . DNR Galvin Proffer 03/29/2014    Priority: High  . Malignant neoplasm of prostate metastatic to bone ([place]) 03/14/2014    Priority: High  . Protein-calorie malnutrition, severe ([place]) 12/27/2013    Priority: Medium  . HTN (hypertension) 12/26/2013    Priority: Medium  . LBBB (left bundle branch block) 12/26/2013    Priority: Medium  . Osteoarthritis 08/16/2014    Priority: Low    Medications- reviewed and updated Current Outpatient Prescriptions  Medication Sig Dispense Refill  . tamsulosin (FLOMAX) 0.4 MG CAPS capsule TAKE 1 CAPSULE (0.4 MG TOTAL) BY MOUTH DAILY AFTER BREAKFAST. 30 capsule 5  . traMADol (ULTRAM) 50 MG tablet Take 0.5 tablets (25 mg total) by mouth every 12 (twelve) hours as needed. 20 tablet 0   No current facility-administered medications for this visit.    Objective: BP 118/68 mmHg  Pulse 69  Temp(Src) 98 F (36.7 C)  Wt 138 lb (62.596 kg) Gen: NAD, resting comfortably CV: RRR no murmurs rubs or gallops Lungs: CTAB no crackles, wheeze, rhonchi Abdomen: soft/nontender/nondistended/normal bowel sounds. No rebound or guarding.  Ext: trace edema R knee with medial joint tenderness. No effusion. Skin: warm, dry Neuro: CN II-XII intact, sensation and reflexes normal throughout, 5/5 muscle strength in bilateral upper and lower extremities- when leg is lifted patient shows 5/5 strength in flexion when palpating knee jiont. Normal finger to nose. Normal rapid alternating movements. No pronator drift. Normal gait.    Assessment/Plan:  Osteoarthritis Right knee osteoarthritis  S: at last visit we discussed potential diagnostic and therapeutic trial of injection  despite not having knee films yet. Long-term history of right knee pain. Since that time, he has done ok with prn ibuprofen until about 2-3 weeks ago when pain has progressed. He feels like pain is so severe that he cannot stand most of the time. He had no fall or injury. May have some pain into leg as well. No paresthesia. No clear weakness but pain does stop him from activities. Son in law Sees Dr. Alfonso Ramus at [facility].  A/P: I believe patient needs x-ray of the knee. He may also benefit from lumbar films. We discussed ordering these and then following up in office versus referral to orthopedics so these can be done in one place. Patient opts for referral to orthopedics.  For pain control :r using ibuprofen about twice a day. We will schedule Tylenol 650 mg 3 times a day. Then he can use ibuprofen as needed. If that regimen does not work he is also given a half tablet of tramadol as needed   Protein-calorie malnutrition, severe S: Compliant with in ensure twice daily. Weight has been stable. A/P: Continue to monitor weight trend. Meals on Wheels and ensure is helping.   HTN (hypertension) S: controlled. Remains off medication  BP Readings from Last 3 Encounters:  08/15/15 118/68  02/07/15 130/70  08/16/14 110/64  A/P:Continue without medication but continue to monitor trend as has required medicine in the past  Due to reported issues with standing, did do a neurological exam which did not reveal any deficits to suggest a CVA   6 months or when necessary Return precautions advised.   Orders Placed  This Encounter  Procedures  . Ambulatory referral to Orthopedic Surgery    Referral Priority:  Routine    Referral Type:  Surgical    Referral Reason:  Specialty Services Required    Requested Specialty:  Orthopedic Surgery    Number of Visits Requested:  1   Meds ordered this encounter  Medications  . traMADol (ULTRAM) 50 MG tablet    Sig: Take 0.5 tablets (25 mg total) by mouth  every 12 (twelve) hours as needed.    Dispense:  20 tablet    Refill:  0

## 2015-08-15 NOTE — Assessment & Plan Note (Addendum)
Right knee osteoarthritis  S: at last visit we discussed potential diagnostic and therapeutic trial of injection despite not having knee films yet. Long-term history of right knee pain. Since that time, he has done ok with prn ibuprofen until about 2-3 weeks ago when pain has progressed. He feels like pain is so severe that he cannot stand most of the time. He had no fall or injury. May have some pain into leg as well. No paresthesia. No clear weakness but pain does stop him from activities. Son in law Sees Dr. Alfonso Ramus at [facility].  A/P: I believe patient needs x-ray of the knee. He may also benefit from lumbar films. We discussed ordering these and then following up in office versus referral to orthopedics so these can be done in one place. Patient opts for referral to orthopedics.  For pain control :r using ibuprofen about twice a day. We will schedule Tylenol 650 mg 3 times a day. Then he can use ibuprofen as needed. If that regimen does not work he is also given a half tablet of tramadol as needed

## 2015-08-15 NOTE — Patient Instructions (Signed)
We will call you within a week about your referral to [facility] Dr. Alfonso Ramus. If you do not hear within a week, give [place] a call.   Blood pressure looks good on recheck.

## 2015-09-20 ENCOUNTER — Telehealth: Payer: Self-pay | Admitting: Family Medicine

## 2015-09-20 NOTE — Telephone Encounter (Signed)
Son states their is no change in mental states. Lawyer states they are fine with this also. Just need a letter from you stating this. Lawyer states it needs to state  Pt is competent and has the mental capability to make a decision on his will and the division of his property. Thanks you.

## 2015-09-20 NOTE — Telephone Encounter (Signed)
Ok to right letter?

## 2015-09-20 NOTE — Telephone Encounter (Signed)
Letter upfront, left message on pt son in law vm to make him aware.

## 2015-09-20 NOTE — Telephone Encounter (Signed)
Pt son in law call to ask for a letter stating that pt can make his own decision.  Does the pt need an appt

## 2015-09-20 NOTE — Telephone Encounter (Signed)
Yes thanks- may complete letter

## 2015-09-20 NOTE — Telephone Encounter (Signed)
See below

## 2015-09-20 NOTE — Telephone Encounter (Signed)
Yes unless there has been a change in his mental state. I have no reason to believe he cannot make his own decisions.

## 2016-01-18 ENCOUNTER — Other Ambulatory Visit: Payer: Self-pay | Admitting: Family Medicine

## 2016-02-13 ENCOUNTER — Encounter: Payer: Self-pay | Admitting: Family Medicine

## 2016-02-13 ENCOUNTER — Ambulatory Visit (INDEPENDENT_AMBULATORY_CARE_PROVIDER_SITE_OTHER): Payer: Medicare Other | Admitting: Family Medicine

## 2016-02-13 VITALS — BP 116/72 | HR 95 | Temp 97.6°F | Wt 120.0 lb

## 2016-02-13 DIAGNOSIS — M79604 Pain in right leg: Secondary | ICD-10-CM | POA: Diagnosis not present

## 2016-02-13 DIAGNOSIS — I10 Essential (primary) hypertension: Secondary | ICD-10-CM | POA: Diagnosis not present

## 2016-02-13 DIAGNOSIS — R6889 Other general symptoms and signs: Secondary | ICD-10-CM | POA: Diagnosis not present

## 2016-02-13 DIAGNOSIS — E43 Unspecified severe protein-calorie malnutrition: Secondary | ICD-10-CM | POA: Diagnosis not present

## 2016-02-13 DIAGNOSIS — M159 Polyosteoarthritis, unspecified: Secondary | ICD-10-CM

## 2016-02-13 DIAGNOSIS — M150 Primary generalized (osteo)arthritis: Secondary | ICD-10-CM

## 2016-02-13 NOTE — Assessment & Plan Note (Signed)
S: I am worried about weight loss. Appetite very low. Does 2-3 ensure a day but foot intake is very poor. Interest is low even if they pick him up anything they want. Apparently prostate cancer has been stable- not worsening Wt Readings from Last 3 Encounters:  02/13/16 120 lb (54.432 kg)  08/15/15 138 lb (62.596 kg)  02/07/15 137 lb (62.143 kg)  A/P: i worry about patient decline. Wonder if leg pain contributing- refer to ortho and consider taking more tramadol- if continued issues we discussed potential hospice consult given age and decline.

## 2016-02-13 NOTE — Assessment & Plan Note (Signed)
Right leg pain S: continues to complain of knee pain but now states leg all through upper leg and into groin. sometimes so severe cannot lift foot 2 inches off foot. Hurts into groin. I am concerned about potential mets to bone with prostate cancer history A/P: discussed films here vs. Ortho and jointly decided on orthopedics consult at this time- consider knee, leg, low back films

## 2016-02-13 NOTE — Progress Notes (Signed)
Subjective:  Brett Anthony is a 82 y.o. year old very pleasant male patient who presents for/with See problem oriented charting ROS- poor appetite, right leg pain noted. No chest pain or shortness of breath. .see any ROS included in HPI as well.   Past Medical History-  Patient Active Problem List   Diagnosis Date Noted  . DNR Galvin Proffer 03/29/2014    Priority: High  . Malignant neoplasm of prostate metastatic to bone ([place]) 03/14/2014    Priority: High  . Protein-calorie malnutrition, severe ([place]) 12/27/2013    Priority: Medium  . HTN (hypertension) 12/26/2013    Priority: Medium  . LBBB (left bundle branch block) 12/26/2013    Priority: Medium  . Osteoarthritis 08/16/2014    Priority: Low    Medications- reviewed and updated Current Outpatient Prescriptions  Medication Sig Dispense Refill  . tamsulosin (FLOMAX) 0.4 MG CAPS capsule TAKE 1 CAPSULE (0.4 MG TOTAL) BY MOUTH DAILY AFTER BREAKFAST. 30 capsule 5  . traMADol (ULTRAM) 50 MG tablet Take 0.5 tablets (25 mg total) by mouth every 12 (twelve) hours as needed. 20 tablet 0   No current facility-administered medications for this visit.    Objective: BP 116/72 mmHg  Pulse 95  Temp(Src) 97.6 F (36.4 C) (Oral)  Wt 120 lb (54.432 kg)  SpO2 90% Gen: NAD, resting comfortably CV: RRR no murmurs rubs or gallops Lungs: CTAB no crackles, wheeze, rhonchi Abdomen: soft/nontender/nondistended/normal bowel sounds.  Msk: pain in mid thigh with IR and ER hip- not in groin. Mild pain with palpation along joint line of knee.  Ext: no edema Skin: warm, dry, no rash  Assessment/Plan:  Protein-calorie malnutrition, severe S: I am worried about weight loss. Appetite very low. Does 2-3 ensure a day but foot intake is very poor. Interest is low even if they pick him up anything they want. Apparently prostate cancer has been stable- not worsening Wt Readings from Last 3 Encounters:  02/13/16 120 lb (54.432 kg)  08/15/15 138 lb (62.596 kg)   02/07/15 137 lb (62.143 kg)  A/P: i worry about patient decline. Wonder if leg pain contributing- refer to ortho and consider taking more tramadol- if continued issues we discussed potential hospice consult given age and decline.   Osteoarthritis Right leg pain S: continues to complain of knee pain but now states leg all through upper leg and into groin. sometimes so severe cannot lift foot 2 inches off foot. Hurts into groin. I am concerned about potential mets to bone with prostate cancer history A/P: discussed films here vs. Ortho and jointly decided on orthopedics consult at this time- consider knee, leg, low back films   HTN (hypertension) S: controlled off all meds at this poin BP Readings from Last 3 Encounters:  02/13/16 116/72  08/15/15 118/68  02/07/15 130/70  A/P:Continue current meds:  Update labs including TSH, cbc, cmet. Son mentions father feels more cold and seems to be slightly more fatigued.      Return in about 3 months (around 05/15/2016).  Orders Placed This Encounter  Procedures  . CBC with Differential/Platelet  . Comprehensive metabolic panel    [place]  . TSH      . Ambulatory referral to Orthopedic Surgery    Referral Priority:  Routine    Referral Type:  Surgical    Referral Reason:  Specialty Services Required    Requested Specialty:  Orthopedic Surgery    Number of Visits Requested:  1   Return precautions advised.  Garret Reddish, MD

## 2016-02-13 NOTE — Assessment & Plan Note (Signed)
S: controlled off all meds at this poin BP Readings from Last 3 Encounters:  02/13/16 116/72  08/15/15 118/68  02/07/15 130/70  A/P:Continue current meds:  Update labs including TSH, cbc, cmet. Son mentions father feels more cold and seems to be slightly more fatigued.

## 2016-02-13 NOTE — Patient Instructions (Addendum)
Check labs before you go  Try keeping soft foods around with protein such as beans, yogurt, cheese. Hopefully no further weight loss  Refer to orthopedics for right leg pain. We will call you within a week about your referral. If you do not hear within 2 weeks, give [place] a call.   With weight loss- would like to check in 3 months from now

## 2016-02-13 NOTE — Progress Notes (Signed)
Pre visit review using our clinic review tool, if applicable. No additional management support is needed unless otherwise documented below in the visit note. 

## 2016-02-14 LAB — COMPREHENSIVE METABOLIC PANEL
ALBUMIN: 3.8 g/dL (ref 3.5–5.2)
ALK PHOS: 153 U/L — AB (ref 39–117)
ALT: 9 U/L (ref 0–53)
AST: 13 U/L (ref 0–37)
BILIRUBIN TOTAL: 0.7 mg/dL (ref 0.2–1.2)
BUN: 32 mg/dL — AB (ref 6–23)
CO2: 31 mEq/L (ref 19–32)
CREATININE: 1.09 mg/dL (ref 0.40–1.50)
Calcium: 10.2 mg/dL (ref 8.4–10.5)
Chloride: 104 mEq/L (ref 96–112)
GFR: 66.75 mL/min (ref >60.00)
GLUCOSE: 103 mg/dL — AB (ref 70–99)
POTASSIUM: 4.9 meq/L (ref 3.5–5.1)
SODIUM: 141 meq/L (ref 135–145)
TOTAL PROTEIN: 6.3 g/dL (ref 6.0–8.3)

## 2016-02-14 LAB — CBC WITH DIFFERENTIAL/PLATELET
BASOS ABS: 0 10*3/uL (ref 0.0–0.1)
Basophils Relative: 0.4 % (ref 0.0–3.0)
EOS PCT: 4.6 % (ref 0.0–5.0)
Eosinophils Absolute: 0.3 10*3/uL (ref 0.0–0.7)
HCT: 43 % (ref 39.0–52.0)
HEMOGLOBIN: 14.3 g/dL (ref 13.0–17.0)
Lymphocytes Relative: 12.1 % (ref 12.0–46.0)
Lymphs Abs: 0.9 10*3/uL (ref 0.7–4.0)
MCHC: 33.3 g/dL (ref 30.0–36.0)
MCV: 83.4 fl (ref 78.0–100.0)
MONO ABS: 0.6 10*3/uL (ref 0.1–1.0)
MONOS PCT: 7.9 % (ref 3.0–12.0)
NEUTROS PCT: 75 % (ref 43.0–77.0)
Neutro Abs: 5.5 10*3/uL (ref 1.4–7.7)
Platelets: 272 10*3/uL (ref 150.0–400.0)
RBC: 5.16 Mil/uL (ref 4.22–5.81)
RDW: 14.5 % (ref 11.5–15.5)
WBC: 7.3 10*3/uL (ref 4.0–10.5)

## 2016-02-14 LAB — TSH: TSH: 1.15 u[IU]/mL (ref 0.35–4.50)

## 2016-03-01 ENCOUNTER — Other Ambulatory Visit: Payer: Self-pay | Admitting: Family Medicine

## 2016-03-13 ENCOUNTER — Other Ambulatory Visit: Payer: Self-pay

## 2016-03-13 ENCOUNTER — Telehealth: Payer: Self-pay | Admitting: Family Medicine

## 2016-03-13 DIAGNOSIS — R110 Nausea: Secondary | ICD-10-CM

## 2016-03-13 MED ORDER — PROCHLORPERAZINE 25 MG RE SUPP: 25.0000 mg | Freq: Two times a day (BID) | RECTAL | Status: AC | PRN

## 2016-03-13 NOTE — Telephone Encounter (Signed)
Spoke with son in law Elenore Rota and informed him that suppositories had been sent to the pharmacy. Verbalized understanding

## 2016-03-13 NOTE — Telephone Encounter (Signed)
Spoke with son. Hospice consult placed. Hopeful for RN visit tomorrow. Tells me his father in law has stated he is ready to go and has for a few days. Gradual decline since last visit  Jamie- just FYI, may close visit

## 2016-03-13 NOTE — Telephone Encounter (Signed)
Please advise 

## 2016-03-13 NOTE — Telephone Encounter (Signed)
Son, Timmothy Sours would like to know if Dr Yong Channel will call him to discuss options for the pt.  Pt has stopped eating, very weak, and Timmothy Sours would like you to advise him on future options.  Timmothy Sours is off work today.

## 2016-03-13 NOTE — Telephone Encounter (Signed)
Patient and caregiver are requesting a DNR.  Also patient is experiencing some nausea - Hospice caregiver is requesting Compazine to be ordered.   Hospice caregiver- Newman Nickels [phone]

## 2016-03-15 ENCOUNTER — Telehealth: Payer: Self-pay | Admitting: Family Medicine

## 2016-03-15 NOTE — Telephone Encounter (Signed)
Received call from [facility] that [facility] had been called to the home where patient was pronounced dead. Upon return call they did not answer. They were given a phone number to call back

## 2016-03-31 DEATH — deceased

## 2016-05-14 ENCOUNTER — Ambulatory Visit: Payer: Medicare Other | Admitting: Family Medicine
